# Patient Record
Sex: Female | Born: 1990 | Race: White | Hispanic: No | Marital: Single | Smoking: Former smoker
Health system: Southern US, Community
[De-identification: ages and names within clinical notes are randomized; demographics above are authoritative.]

## PROBLEM LIST (undated history)

## (undated) ENCOUNTER — Inpatient Hospital Stay (HOSPITAL_COMMUNITY): Payer: Self-pay

## (undated) DIAGNOSIS — F32A Depression, unspecified: Secondary | ICD-10-CM

## (undated) DIAGNOSIS — F419 Anxiety disorder, unspecified: Secondary | ICD-10-CM

## (undated) DIAGNOSIS — F329 Major depressive disorder, single episode, unspecified: Secondary | ICD-10-CM

## (undated) DIAGNOSIS — N390 Urinary tract infection, site not specified: Secondary | ICD-10-CM

## (undated) DIAGNOSIS — E282 Polycystic ovarian syndrome: Secondary | ICD-10-CM

## (undated) DIAGNOSIS — R569 Unspecified convulsions: Secondary | ICD-10-CM

## (undated) HISTORY — PX: WISDOM TOOTH EXTRACTION: SHX21

---
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
## 2010-07-10 ENCOUNTER — Emergency Department (HOSPITAL_COMMUNITY): Admission: EM | Admit: 2010-07-10 | Discharge: 2010-04-28 | Payer: Self-pay | Admitting: Emergency Medicine

## 2015-12-28 ENCOUNTER — Encounter (HOSPITAL_COMMUNITY): Payer: Self-pay | Admitting: Family Medicine

## 2015-12-28 ENCOUNTER — Emergency Department (HOSPITAL_COMMUNITY)
Admission: EM | Admit: 2015-12-28 | Discharge: 2015-12-29 | Disposition: A | Discharge status: Other Acute Inpatient Hospital | Payer: 59 | Attending: Emergency Medicine | Admitting: Emergency Medicine

## 2015-12-28 DIAGNOSIS — N760 Acute vaginitis: Secondary | ICD-10-CM | POA: Insufficient documentation

## 2015-12-28 DIAGNOSIS — T39312A Poisoning by propionic acid derivatives, intentional self-harm, initial encounter: Secondary | ICD-10-CM | POA: Diagnosis not present

## 2015-12-28 DIAGNOSIS — F329 Major depressive disorder, single episode, unspecified: Secondary | ICD-10-CM | POA: Diagnosis not present

## 2015-12-28 DIAGNOSIS — T1491 Suicide attempt: Secondary | ICD-10-CM | POA: Diagnosis not present

## 2015-12-28 DIAGNOSIS — F17200 Nicotine dependence, unspecified, uncomplicated: Secondary | ICD-10-CM | POA: Insufficient documentation

## 2015-12-28 DIAGNOSIS — F32A Depression, unspecified: Secondary | ICD-10-CM

## 2015-12-28 DIAGNOSIS — T391X2A Poisoning by 4-Aminophenol derivatives, intentional self-harm, initial encounter: Secondary | ICD-10-CM

## 2015-12-28 DIAGNOSIS — B9689 Other specified bacterial agents as the cause of diseases classified elsewhere: Secondary | ICD-10-CM

## 2015-12-28 HISTORY — DX: Major depressive disorder, single episode, unspecified: F32.9

## 2015-12-28 HISTORY — DX: Depression, unspecified: F32.A

## 2015-12-28 LAB — WET PREP, GENITAL
Sperm: NONE SEEN
Trich, Wet Prep: NONE SEEN
Yeast Wet Prep HPF POC: NONE SEEN

## 2015-12-28 LAB — COMPREHENSIVE METABOLIC PANEL
ALK PHOS: 58 U/L (ref 38–126)
ALT: 12 U/L — AB (ref 14–54)
AST: 18 U/L (ref 15–41)
Albumin: 4.5 g/dL (ref 3.5–5.0)
Anion gap: 8 (ref 5–15)
BUN: 6 mg/dL (ref 6–20)
CALCIUM: 9.8 mg/dL (ref 8.9–10.3)
CHLORIDE: 106 mmol/L (ref 101–111)
CO2: 25 mmol/L (ref 22–32)
CREATININE: 0.88 mg/dL (ref 0.44–1.00)
Glucose, Bld: 97 mg/dL (ref 65–99)
Potassium: 3.6 mmol/L (ref 3.5–5.1)
Sodium: 139 mmol/L (ref 135–145)
Total Bilirubin: 0.4 mg/dL (ref 0.3–1.2)
Total Protein: 7.3 g/dL (ref 6.5–8.1)

## 2015-12-28 LAB — URINALYSIS, ROUTINE W REFLEX MICROSCOPIC
BILIRUBIN URINE: NEGATIVE
GLUCOSE, UA: NEGATIVE mg/dL
HGB URINE DIPSTICK: NEGATIVE
KETONES UR: 15 mg/dL — AB
Leukocytes, UA: NEGATIVE
Nitrite: NEGATIVE
PH: 6 (ref 5.0–8.0)
PROTEIN: NEGATIVE mg/dL
Specific Gravity, Urine: 1.028 (ref 1.005–1.030)

## 2015-12-28 LAB — I-STAT BETA HCG BLOOD, ED (MC, WL, AP ONLY)

## 2015-12-28 LAB — RAPID URINE DRUG SCREEN, HOSP PERFORMED
AMPHETAMINES: NOT DETECTED
Barbiturates: NOT DETECTED
Benzodiazepines: POSITIVE — AB
Cocaine: NOT DETECTED
Opiates: NOT DETECTED
TETRAHYDROCANNABINOL: POSITIVE — AB

## 2015-12-28 LAB — CBC
HCT: 43.1 % (ref 36.0–46.0)
HEMOGLOBIN: 15.3 g/dL — AB (ref 12.0–15.0)
MCH: 30.4 pg (ref 26.0–34.0)
MCHC: 35.5 g/dL (ref 30.0–36.0)
MCV: 85.7 fL (ref 78.0–100.0)
Platelets: 204 10*3/uL (ref 150–400)
RBC: 5.03 MIL/uL (ref 3.87–5.11)
RDW: 12 % (ref 11.5–15.5)
WBC: 7.2 10*3/uL (ref 4.0–10.5)

## 2015-12-28 LAB — SALICYLATE LEVEL

## 2015-12-28 LAB — ETHANOL

## 2015-12-28 LAB — ACETAMINOPHEN LEVEL
ACETAMINOPHEN (TYLENOL), SERUM: 24 ug/mL (ref 10–30)
ACETAMINOPHEN (TYLENOL), SERUM: 17 ug/mL (ref 10–30)
Acetaminophen (Tylenol), Serum: 23 ug/mL (ref 10–30)

## 2015-12-28 MED ORDER — LIDOCAINE HCL (PF) 1 % IJ SOLN
5.0000 mL | Freq: Once | INTRAMUSCULAR | Status: AC
  Administered 2015-12-28: 5 mL
  Filled 2015-12-28: qty 5

## 2015-12-28 MED ORDER — METRONIDAZOLE 500 MG PO TABS
500.0000 mg | ORAL_TABLET | Freq: Two times a day (BID) | ORAL | Status: DC
  Administered 2015-12-28 – 2015-12-29 (×2): 500 mg via ORAL
  Filled 2015-12-28 (×2): qty 1

## 2015-12-28 MED ORDER — AZITHROMYCIN 250 MG PO TABS
1000.0000 mg | ORAL_TABLET | Freq: Once | ORAL | Status: AC
  Administered 2015-12-28: 1000 mg via ORAL
  Filled 2015-12-28: qty 4

## 2015-12-28 MED ORDER — CEFTRIAXONE SODIUM 250 MG IJ SOLR
250.0000 mg | Freq: Once | INTRAMUSCULAR | Status: AC
  Administered 2015-12-28: 250 mg via INTRAMUSCULAR
  Filled 2015-12-28: qty 250

## 2015-12-28 NOTE — ED Notes (Addendum)
medgiven pt pleasant .  She just feels sleepy.  Med ingested 1700 she ate last this am before the pills.  Sandwich 1 hour ago

## 2015-12-28 NOTE — ED Notes (Signed)
The pt has not had an adverse reaction to the rocephin shot  Moved to pod c  rm 24

## 2015-12-28 NOTE — ED Notes (Addendum)
Pt moved from triage no sitter family at the bedside.  Pa saw before myself

## 2015-12-28 NOTE — ED Provider Notes (Addendum)
CSN: [id number]     Arrival date & time 12/28/15  1739 History   First MD Initiated Contact with Patient 12/28/15 2035     Chief Complaint  Patient presents with  . Drug Overdose     (Consider location/radiation/quality/duration/timing/severity/associated sxs/prior Treatment) HPI Comments: Patient with history of depression currently being weaned off zoloft presents with overwhelm and increased depression with suicidal ideation and attempt. She reports taking 6 tylenol and 6 alleve at 5:00 pm. No postingestion vomiting. She denies history of attempt or suicidal gesture. She complains of vaginal discharge, "I think I have a bacterial infection". No abdominal pain or fever. No nausea. She denies dysuria but reports she has to strain for urination x 1-2 days.   The history is provided by the patient. No language interpreter was used.    Past Medical History  Diagnosis Date  . Depression    History reviewed. No pertinent past surgical history. History reviewed. No pertinent family history. Social History  Substance Use Topics  . Smoking status: Current Every Day Smoker  . Smokeless tobacco: None  . Alcohol Use: No   OB History    No data available     Review of Systems  Constitutional: Negative for fever and chills.  Respiratory: Negative.  Negative for shortness of breath.   Cardiovascular: Negative.  Negative for chest pain.  Gastrointestinal: Negative.  Negative for nausea, vomiting and abdominal pain.  Genitourinary: Positive for vaginal discharge and difficulty urinating. Negative for dysuria, vaginal bleeding and menstrual problem.  Musculoskeletal: Negative.  Negative for myalgias.  Skin: Positive for rash (inner thighs).  Neurological: Negative.   Psychiatric/Behavioral: Positive for suicidal ideas and dysphoric mood.      Allergies  Review of patient's allergies indicates no known allergies.  Home Medications   Prior to Admission medications   Not on File    BP 126/80 mmHg  Pulse 85  Temp(Src) 99 F (37.2 C) (Oral)  Resp 12  Ht  (1.6 m)  SpO2 100%  LMP 12/15/2015 Physical Exam  Constitutional: She is oriented to person, place, and time. She appears well-developed and well-nourished.  HENT:  Head: Normocephalic.  Neck: Normal range of motion. Neck supple.  Cardiovascular: Normal rate and regular rhythm.   Pulmonary/Chest: Effort normal and breath sounds normal.  Abdominal: Soft. Bowel sounds are normal. There is no tenderness. There is no rebound and no guarding.  Genitourinary:  Thin, grayish discharge present in vaginal vault. No CMT, adnexal mass or tenderness. Cervix non-friable.  Musculoskeletal: Normal range of motion.  Neurological: She is alert and oriented to person, place, and time.  Skin: Skin is warm and dry. No rash noted.  Psychiatric: Her speech is normal. She exhibits a depressed mood. She expresses suicidal ideation.    ED Course  Procedures (including critical care time) Labs Review Labs Reviewed  COMPREHENSIVE METABOLIC PANEL - Abnormal; Notable for the following:    ALT 12 (*)    All other components within normal limits  CBC - Abnormal; Notable for the following:    Hemoglobin 15.3 (*)    All other components within normal limits  URINE RAPID DRUG SCREEN, HOSP PERFORMED - Abnormal; Notable for the following:    Benzodiazepines POSITIVE (*)    Tetrahydrocannabinol POSITIVE (*)    All other components within normal limits  WET PREP, GENITAL  ETHANOL  SALICYLATE LEVEL  ACETAMINOPHEN LEVEL  ACETAMINOPHEN LEVEL  RPR  HIV ANTIBODY (ROUTINE TESTING)  URINALYSIS, ROUTINE W REFLEX MICROSCOPIC (NOT AT [facility])  I-STAT BETA HCG BLOOD, ED ([facility], [facility], [facility] ONLY)  GC/CHLAMYDIA PROBE AMP ([place]) NOT AT [facility]    Imaging Review No results found. I have personally reviewed and evaluated these images and lab results as part of my medical decision-making.   EKG Interpretation None      MDM   Final  diagnoses:  None    1. Suicide attempt 2. BV  The patient presents after intentional overdose ingestion of Tylenol and Aleve. She is considered medically cleared for TTS evaluation and treatment.   She complained of vaginal discharge and exam and tests reveal BV. She requests full STD panel testing and RPR, HIV, GC/chlamydia are pending. She has been treated with Zithromax (1 gm) and Rocephin (250 mg IM) based on concern for exposure.   10:40 - [facility] transfer after 7:00 am, accepting Dr. Jama Flavorsobos. Behavioral Health NP requiring a 3rd Tylenol level which has been ordered.     Elpidio AnisShari Sabine Tenenbaum, PA-C 12/28/15 2225  Marily MemosJason Mesner, MD 12/28/15 2235  Elpidio AnisShari Dayanne Yiu, PA-C 12/30/15 0022  Marily MemosJason Mesner, MD 12/30/15 [phone]
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
## 2015-12-28 NOTE — ED Notes (Signed)
Doing TTS assessment at this time.  Family left for the night.

## 2015-12-28 NOTE — ED Notes (Signed)
Pt sts that she took 6 tylenol PM and 6 aleve pm about 1 hour ago. sts that she was having suicidal thoughts.

## 2015-12-29 ENCOUNTER — Inpatient Hospital Stay (HOSPITAL_COMMUNITY)
Admission: AD | Admit: 2015-12-29 | Discharge: 2015-12-31 | DRG: 885 | Disposition: A | Discharge status: Home / Self Care | Payer: 59 | Source: Intra-hospital | Attending: Psychiatry | Admitting: Psychiatry

## 2015-12-29 ENCOUNTER — Encounter (HOSPITAL_COMMUNITY): Payer: Self-pay | Admitting: *Deleted

## 2015-12-29 DIAGNOSIS — G4700 Insomnia, unspecified: Secondary | ICD-10-CM | POA: Diagnosis present

## 2015-12-29 DIAGNOSIS — F410 Panic disorder [episodic paroxysmal anxiety] without agoraphobia: Secondary | ICD-10-CM | POA: Diagnosis present

## 2015-12-29 DIAGNOSIS — Z818 Family history of other mental and behavioral disorders: Secondary | ICD-10-CM

## 2015-12-29 DIAGNOSIS — F332 Major depressive disorder, recurrent severe without psychotic features: Principal | ICD-10-CM | POA: Diagnosis present

## 2015-12-29 DIAGNOSIS — F17200 Nicotine dependence, unspecified, uncomplicated: Secondary | ICD-10-CM | POA: Diagnosis present

## 2015-12-29 DIAGNOSIS — F4000 Agoraphobia, unspecified: Secondary | ICD-10-CM | POA: Diagnosis present

## 2015-12-29 DIAGNOSIS — F329 Major depressive disorder, single episode, unspecified: Secondary | ICD-10-CM | POA: Diagnosis present

## 2015-12-29 DIAGNOSIS — T1491 Suicide attempt: Secondary | ICD-10-CM | POA: Diagnosis not present

## 2015-12-29 LAB — HIV ANTIBODY (ROUTINE TESTING W REFLEX): HIV Screen 4th Generation wRfx: NONREACTIVE

## 2015-12-29 LAB — RPR: RPR: NONREACTIVE

## 2015-12-29 MED ORDER — METRONIDAZOLE 500 MG PO TABS
500.0000 mg | ORAL_TABLET | Freq: Two times a day (BID) | ORAL | Status: DC
  Administered 2015-12-29 – 2015-12-31 (×4): 500 mg via ORAL
  Filled 2015-12-29 (×8): qty 1

## 2015-12-29 MED ORDER — ACETAMINOPHEN 325 MG PO TABS: 650.0000 mg | ORAL_TABLET | Freq: Four times a day (QID) | ORAL | Status: DC | PRN

## 2015-12-29 MED ORDER — ALUM & MAG HYDROXIDE-SIMETH 200-200-20 MG/5ML PO SUSP
30.0000 mL | ORAL | Status: DC | PRN
  Administered 2015-12-30: 30 mL via ORAL
  Filled 2015-12-29: qty 30

## 2015-12-29 MED ORDER — MAGNESIUM HYDROXIDE 400 MG/5ML PO SUSP: 30.0000 mL | Freq: Every day | ORAL | Status: DC | PRN

## 2015-12-29 MED ORDER — NICOTINE 14 MG/24HR TD PT24
14.0000 mg | MEDICATED_PATCH | Freq: Once | TRANSDERMAL | Status: DC
Start: 2015-12-29 — End: 2015-12-29
  Administered 2015-12-29: 14 mg via TRANSDERMAL
  Filled 2015-12-29: qty 1

## 2015-12-29 MED ORDER — TRAZODONE HCL 50 MG PO TABS
50.0000 mg | ORAL_TABLET | Freq: Every evening | ORAL | Status: DC | PRN
  Administered 2015-12-30: 50 mg via ORAL
  Filled 2015-12-29: qty 1

## 2015-12-29 MED ORDER — FLUOXETINE HCL 20 MG PO CAPS
20.0000 mg | ORAL_CAPSULE | Freq: Every day | ORAL | Status: DC
  Filled 2015-12-29 (×4): qty 1

## 2015-12-29 NOTE — ED Notes (Signed)
Pt signed [facility]- [facility], copy placed in medical records and [facility].
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
## 2015-12-29 NOTE — Progress Notes (Signed)
Adult Psychoeducational Group Note  Date:  12/29/2015 Time:  8:15 PM  Group Topic/Focus:  Wrap-Up Group:   The focus of this group is to help patients review their daily goal of treatment and discuss progress on daily workbooks.  Participation Level:  Active  Participation Quality:  Appropriate  Affect:  Irritable and Tearful  Cognitive:  Appropriate  Insight: Appropriate  Engagement in Group:  Engaged  Modes of Intervention:  Discussion  Additional Comments:  Pt was tearful during wrap-up group and seemed slightly angry. Pt rated her overall day "a 10 with other patients and a 3 with staff". Pt was upset during wrap-up group because a staff member told her that she could not be on the phone during wrap-up group. Pt reported that she did not have a goal for the day.   Cleotilde NeerJasmine S [place] 12/29/2015, 9:05 PM

## 2015-12-29 NOTE — ED Notes (Signed)
Pt on phone notifying family member of [facility].

## 2015-12-29 NOTE — ED Notes (Addendum)
No Sitter available for pt at this time per Penni BombardK Moon, Consulting civil engineerCharge RN. RN monitoring pt.

## 2015-12-29 NOTE — Tx Team (Signed)
Initial Interdisciplinary Treatment Plan   PATIENT STRESSORS: Educational concerns Financial difficulties Legal issue Marital or family conflict Occupational concerns Traumatic event   PATIENT STRENGTHS: Ability for insight Active sense of humor Average or above average intelligence   PROBLEM LIST: Problem List/Patient Goals Date to be addressed Date deferred Reason deferred Estimated date of resolution  Depression with suicidal ideation 12/29/2015     Self injurious behaviors 12/29/2015                 :" I love my son" 12/29/2015     " I take good care of my son" 12/29/2015                        DISCHARGE CRITERIA:  Ability to meet basic life and health needs Adequate post-discharge living arrangements Improved stabilization in mood, thinking, and/or behavior  PRELIMINARY DISCHARGE PLAN: Attend aftercare/continuing care group Attend PHP/IOP Attend 12-step recovery group Outpatient therapy  PATIENT/FAMIILY INVOLVEMENT: This treatment plan has been presented to and reviewed with the patient, Melanie Serrano, and/or family member, .  The patient and family have been given the opportunity to ask questions and make suggestions.  Rich BraveDuke, Kissa Campoy Lynn 12/29/2015, 2:32 PM

## 2015-12-29 NOTE — [facility] Suicide Risk Assessment (Signed)
[facility]   Nursing information obtained from:   patient and chart  Demographic factors:   25 year old  Married female , one child Current Mental Status:   see below  Loss Factors:   unemployment, father in law using drugs  Historical Factors:   depression, anxiety  Risk Reduction Factors:   resilience, sense of responsibility to family   Total Time spent with patient: 45 minutes Principal Problem: MDD  Diagnosis:   Patient Active Problem List   Diagnosis Date Noted  . MDD (major depressive disorder) (HCC) [F32.9] 12/29/2015  . MDD (major depressive disorder), recurrent severe, without psychosis (HCC) [F33.2] 12/29/2015     Continued Clinical Symptoms:  Alcohol Use Disorder Identification Test Final Score (AUDIT): 0 The "Alcohol Use Disorders Identification Test", Guidelines for Use in Primary Care, Second Edition.  World Science writerHealth Organization [facility](WHO). Score between 0-7:  no or low risk or alcohol related problems. Score between 8-15:  moderate risk of alcohol related problems. Score between 16-19:  high risk of alcohol related problems. Score 20 or above:  warrants further diagnostic evaluation for alcohol dependence and treatment.   CLINICAL FACTORS:  25 year old married female, status post impulsive overdose/suicide attempt- ingested several NSAID, Acetaminophen. Reports significant  Psychosocial stressors, to include unemployment and father in law using drugs. She presents depressed, tearful, but denies suicidal ideations at this time    Psychiatric Specialty Exam: Physical Exam  ROS  Blood pressure 118/77, pulse 112, temperature 98.5 F (36.9 C), temperature source Oral, resp. rate 15, height 5' 3.75" (1.619 m), weight 140 lb (63.504 kg), last menstrual period 12/15/2015, SpO2 100 %.Body mass index is 24.23 kg/(m^2).   see admit note MSE    COGNITIVE FEATURES THAT CONTRIBUTE TO RISK:  Closed-mindedness and Loss of executive function    SUICIDE  RISK:   Moderate:  Frequent suicidal ideation with limited intensity, and duration, some specificity in terms of plans, no associated intent, good self-control, limited dysphoria/symptomatology, some risk factors present, and identifiable protective factors, including available and accessible social support.  PLAN OF CARE: Patient will be admitted to inpatient psychiatric unit for stabilization and safety. Will provide and encourage milieu participation. Provide medication management and maked adjustments as needed.  Will follow daily.    I certify that inpatient services furnished can reasonably be expected to improve the patient's condition.   Nehemiah MassedOBOS, Charmian Forbis, MD 12/29/2015, 4:26 PM

## 2015-12-29 NOTE — ED Notes (Addendum)
[id number] [place]

## 2015-12-29 NOTE — ED Provider Notes (Signed)
[facility] Jama Flavorsobos.  BP 110/64 mmHg  Pulse 71  Temp(Src) 98.2 F (36.8 C) (Oral)  Resp 16  Ht 5\' 3"  (1.6 m)  SpO2 100%  LMP 12/15/2015   Glynn OctaveStephen Tyna Huertas, MD 12/29/15 1015

## 2015-12-29 NOTE — [facility] Group Notes (Signed)
[facility] LCSW Group Therapy  12/29/2015 10:00 AM  Type of Therapy:  Group Therapy  Participation Level:  Did Not Attend   Beverly SessionsLINDSEY, Josip Merolla J 12/29/2015, 1:13 PM
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
## 2015-12-30 MED ORDER — NICOTINE 21 MG/24HR TD PT24
21.0000 mg | MEDICATED_PATCH | Freq: Every day | TRANSDERMAL | Status: DC
  Administered 2015-12-30: 21 mg via TRANSDERMAL
  Filled 2015-12-30 (×3): qty 1

## 2015-12-30 NOTE — Progress Notes (Signed)
Recreation Therapy Notes  Date: 05.29.2017 Time: 9:30am Location: [address] Group Room   Group Topic: Stress Management  Goal Area(s) Addresses:  Patient will actively participate in stress management techniques presented during session.   Behavioral Response: Engaged, Attentive, Appropriate   Intervention: Stress management techniques  Activity :  Deep Breathing and Progressive Body Relaxation. LRT provided education, instruction and demonstration on practice of Deep Breathing and Progressive Body Relaxation. Patient was asked to participate in technique introduced during session.   Education:  Stress Management, Discharge Planning.   Education Outcome: Acknowledges education  Clinical Observations/Feedback: Patient actively engaged in technique introduced, expressed no concerns and demonstrated ability to practice independently post d/c.   Marykay Lexenise L Raman [place], LRT/CTRS        Jearl KlinefelterBlanchfield, Elliyah Liszewski L 12/30/2015 2:39 PM

## 2015-12-30 NOTE — Progress Notes (Signed)
D: Pt presents anxious on approach. Pt rates depression 6/10. Hopeless 6/10. Pt denies suicidal thoughts. Pt refused morning dose of Prozac. Pt requesting to speak to MD before starting a new med. MD made aware. Pt reported that she's utilizing her coping skills to cope with depression. Pt stated that her depression stimulated from her poor eating and self neglect. Pt verbalized that her appetite is fair today. Pt compliant with attending groups today. A: Medications administered as ordered per MD. Medications reviewed with pt. Verbal support provided. Pt encouraged to attend groups. R: Pt stated goal "eat a whole meal". Pt receptive to tx.

## 2015-12-30 NOTE — Progress Notes (Signed)
[facility] MD Progress Note  12/30/2015 4:37 PM Melanie Serrano  MRN:  [id number] Subjective:   Patient reports she is feeling better than on admission, and reports having " a better outlook".  Objective : I have reviewed chart notes and have met with patient. Today presents with improved mood and range of affect.  No disruptive or agitated behaviors on unit, going to some groups. Has been noted to be interacting well , socializing with peers , visible in milieu.  States she has learnt  " a lot" in groups and today spoke about learning techniques to " relax, and stay calm". Today reported some ambivalence about taking Prozac ( or another antidepressant ) , expressing concern about possible side effects, and stating that she prefers " natural"/ holistic treatments . We reviewed Prozac  side effect profile , safety considerations . States she slept better last night. As she improves she is focusing more on discharging soon.    Principal Problem: MDD (major depressive disorder) ([place]) Diagnosis:   Patient Active Problem List   Diagnosis Date Noted  . MDD (major depressive disorder) ([place]) [F32.9] 12/29/2015  . MDD (major depressive disorder), recurrent severe, without psychosis ([place]) [F33.2] 12/29/2015   Total Time spent with patient: 20 minutes     Past Medical History:  Past Medical History  Diagnosis Date  . Depression    History reviewed. No pertinent past surgical history. Family History: History reviewed. No pertinent family history.  Social History:  History  Alcohol Use No     History  Drug Use Not on file    Social History   Social History  . Marital Status: Married    Spouse Name: N/A  . Number of Children: N/A  . Years of Education: N/A   Social History Main Topics  . Smoking status: Current Every Day Smoker  . Smokeless tobacco: None  . Alcohol Use: No  . Drug Use: None  . Sexual Activity: Yes    Birth Control/ Protection: None   Other Topics Concern  . None    Social History Narrative   Additional Social History:    Pain Medications: N/A Prescriptions: N/A Over the Counter: tok overdose of tylenol and aleve on sat 5/27 History of alcohol / drug use?: Yes Negative Consequences of Use: Financial, Legal, Personal relationships Name of Substance 1: Marijuana 1 - Age of First Use: 24 1 - Amount (size/oz): 1/4 gram 1 - Frequency: Daily 1 - Duration: One year 1 - Last Use / Amount: 12/26/15  Sleep: Good  Appetite:  Good  Current Medications: Current Facility-Administered Medications  Medication Dose Route Frequency Provider Last Rate Last Dose  . acetaminophen (TYLENOL) tablet 650 mg  650 mg Oral Q6H PRN Derrill Center, NP      . alum & mag hydroxide-simeth (MAALOX/MYLANTA) 200-200-20 MG/5ML suspension 30 mL  30 mL Oral Q4H PRN Derrill Center, NP      . FLUoxetine (PROZAC) capsule 20 mg  20 mg Oral Daily Jenne Campus, MD   20 mg at 12/30/15 0805  . magnesium hydroxide (MILK OF MAGNESIA) suspension 30 mL  30 mL Oral Daily PRN Derrill Center, NP      . metroNIDAZOLE (FLAGYL) tablet 500 mg  500 mg Oral Q12H Lurena Nida, NP   500 mg at 12/30/15 0803  . nicotine (NICODERM CQ - dosed in mg/24 hours) patch 21 mg  21 mg Transdermal Daily Myer Peer Cobos, MD   21 mg at 12/30/15 1045  .  traZODone (DESYREL) tablet 50 mg  50 mg Oral QHS PRN Lurena Nida, NP        Lab Results:  Results for orders placed or performed during the hospital encounter of 12/28/15 (from the past 48 hour(s))  Comprehensive metabolic panel     Status: Abnormal   Collection Time: 12/28/15  6:13 PM  Result Value Ref Range   Sodium 139 135 - 145 mmol/L   Potassium 3.6 3.5 - 5.1 mmol/L   Chloride 106 101 - 111 mmol/L   CO2 25 22 - 32 mmol/L   Glucose, Bld 97 65 - 99 mg/dL   BUN 6 6 - 20 mg/dL   Creatinine, Ser 0.88 0.44 - 1.00 mg/dL   Calcium 9.8 8.9 - 10.3 mg/dL   Total Protein 7.3 6.5 - 8.1 g/dL   Albumin 4.5 3.5 - 5.0 g/dL   AST 18 15 - 41 U/L   ALT 12 (L)  14 - 54 U/L   Alkaline Phosphatase 58 38 - 126 U/L   Total Bilirubin 0.4 0.3 - 1.2 mg/dL   GFR calc non Af Amer >60 >60 mL/min   GFR calc Af Amer >60 >60 mL/min    Comment: (NOTE) The eGFR has been calculated using the CKD EPI equation. This calculation has not been validated in all clinical situations. eGFR's persistently <60 mL/min signify possible Chronic Kidney Disease.    Anion gap 8 5 - 15  Ethanol     Status: None   Collection Time: 12/28/15  6:13 PM  Result Value Ref Range   Alcohol, Ethyl (B) <5 <5 mg/dL    Comment:        LOWEST DETECTABLE LIMIT FOR SERUM ALCOHOL IS 5 mg/dL FOR MEDICAL PURPOSES ONLY   Salicylate level     Status: None   Collection Time: 12/28/15  6:13 PM  Result Value Ref Range   Salicylate Lvl <1.1 2.8 - 30.0 mg/dL  Acetaminophen level     Status: None   Collection Time: 12/28/15  6:13 PM  Result Value Ref Range   Acetaminophen (Tylenol), Serum 23 10 - 30 ug/mL    Comment:        THERAPEUTIC CONCENTRATIONS VARY SIGNIFICANTLY. A RANGE OF 10-30 ug/mL MAY BE AN EFFECTIVE CONCENTRATION FOR MANY PATIENTS. HOWEVER, SOME ARE BEST TREATED AT CONCENTRATIONS OUTSIDE THIS RANGE. ACETAMINOPHEN CONCENTRATIONS >150 ug/mL AT 4 HOURS AFTER INGESTION AND >50 ug/mL AT 12 HOURS AFTER INGESTION ARE OFTEN ASSOCIATED WITH TOXIC REACTIONS.   cbc     Status: Abnormal   Collection Time: 12/28/15  6:13 PM  Result Value Ref Range   WBC 7.2 4.0 - 10.5 K/uL   RBC 5.03 3.87 - 5.11 MIL/uL   Hemoglobin 15.3 (H) 12.0 - 15.0 g/dL   HCT 43.1 36.0 - 46.0 %   MCV 85.7 78.0 - 100.0 fL   MCH 30.4 26.0 - 34.0 pg   MCHC 35.5 30.0 - 36.0 g/dL   RDW 12.0 11.5 - 15.5 %   Platelets 204 150 - 400 K/uL  I-Stat beta hCG blood, ED     Status: None   Collection Time: 12/28/15  6:23 PM  Result Value Ref Range   I-stat hCG, quantitative <5.0 <5 mIU/mL   Comment 3            Comment:   GEST. AGE      CONC.  (mIU/mL)   <=1 WEEK        5 - 50     2 WEEKS  50 - 500     3  WEEKS       100 - 10,000     4 WEEKS     1,000 - 30,000        FEMALE AND NON-PREGNANT FEMALE:     LESS THAN 5 mIU/mL   Rapid urine drug screen (hospital performed)     Status: Abnormal   Collection Time: 12/28/15  7:07 PM  Result Value Ref Range   Opiates NONE DETECTED NONE DETECTED   Cocaine NONE DETECTED NONE DETECTED   Benzodiazepines POSITIVE (A) NONE DETECTED   Amphetamines NONE DETECTED NONE DETECTED   Tetrahydrocannabinol POSITIVE (A) NONE DETECTED   Barbiturates NONE DETECTED NONE DETECTED    Comment:        DRUG SCREEN FOR MEDICAL PURPOSES ONLY.  IF CONFIRMATION IS NEEDED FOR ANY PURPOSE, NOTIFY LAB WITHIN 5 DAYS.        LOWEST DETECTABLE LIMITS FOR URINE DRUG SCREEN Drug Class       Cutoff (ng/mL) Amphetamine      1000 Barbiturate      200 Benzodiazepine   809 Tricyclics       983 Opiates          300 Cocaine          300 THC              50   Urinalysis, Routine w reflex microscopic     Status: Abnormal   Collection Time: 12/28/15  7:19 PM  Result Value Ref Range   Color, Urine AMBER (A) YELLOW    Comment: BIOCHEMICALS MAY BE AFFECTED BY COLOR   APPearance CLOUDY (A) CLEAR   Specific Gravity, Urine 1.028 1.005 - 1.030   pH 6.0 5.0 - 8.0   Glucose, UA NEGATIVE NEGATIVE mg/dL   Hgb urine dipstick NEGATIVE NEGATIVE   Bilirubin Urine NEGATIVE NEGATIVE   Ketones, ur 15 (A) NEGATIVE mg/dL   Protein, ur NEGATIVE NEGATIVE mg/dL   Nitrite NEGATIVE NEGATIVE   Leukocytes, UA NEGATIVE NEGATIVE    Comment: MICROSCOPIC NOT DONE ON URINES WITH NEGATIVE PROTEIN, BLOOD, LEUKOCYTES, NITRITE, OR GLUCOSE <1000 mg/dL.  Wet prep, genital     Status: Abnormal   Collection Time: 12/28/15  9:08 PM  Result Value Ref Range   Yeast Wet Prep HPF POC NONE SEEN NONE SEEN   Trich, Wet Prep NONE SEEN NONE SEEN   Clue Cells Wet Prep HPF POC PRESENT (A) NONE SEEN   WBC, Wet Prep HPF POC FEW (A) NONE SEEN   Sperm NONE SEEN   Acetaminophen level     Status: None   Collection Time:  12/28/15  9:09 PM  Result Value Ref Range   Acetaminophen (Tylenol), Serum 24 10 - 30 ug/mL    Comment:        THERAPEUTIC CONCENTRATIONS VARY SIGNIFICANTLY. A RANGE OF 10-30 ug/mL MAY BE AN EFFECTIVE CONCENTRATION FOR MANY PATIENTS. HOWEVER, SOME ARE BEST TREATED AT CONCENTRATIONS OUTSIDE THIS RANGE. ACETAMINOPHEN CONCENTRATIONS >150 ug/mL AT 4 HOURS AFTER INGESTION AND >50 ug/mL AT 12 HOURS AFTER INGESTION ARE OFTEN ASSOCIATED WITH TOXIC REACTIONS.   RPR     Status: None   Collection Time: 12/28/15  9:09 PM  Result Value Ref Range   RPR Ser Ql Non Reactive Non Reactive    Comment: (NOTE) Performed At: [facility] [address] [place], [place] [id number] Lindon Romp MD [id number]   HIV antibody     Status: None   Collection Time: 12/28/15  9:09 PM  Result Value Ref Range   HIV Screen 4th Generation wRfx Non Reactive Non Reactive    Comment: (NOTE) Performed At: [facility] [place], [place] [id number] Lindon Romp MD [id number]   Acetaminophen level     Status: None   Collection Time: 12/28/15 10:57 PM  Result Value Ref Range   Acetaminophen (Tylenol), Serum 17 10 - 30 ug/mL    Comment:        THERAPEUTIC CONCENTRATIONS VARY SIGNIFICANTLY. A RANGE OF 10-30 ug/mL MAY BE AN EFFECTIVE CONCENTRATION FOR MANY PATIENTS. HOWEVER, SOME ARE BEST TREATED AT CONCENTRATIONS OUTSIDE THIS RANGE. ACETAMINOPHEN CONCENTRATIONS >150 ug/mL AT 4 HOURS AFTER INGESTION AND >50 ug/mL AT 12 HOURS AFTER INGESTION ARE OFTEN ASSOCIATED WITH TOXIC REACTIONS.     Blood Alcohol level:  Lab Results  Component Value Date   ETH <5 12/28/2015    Physical Findings: AIMS: Facial and Oral Movements Muscles of Facial Expression: None, normal Lips and Perioral Area: None, normal Jaw: None, normal Tongue: None, normal,Extremity Movements Upper (arms, wrists, hands, fingers): None, normal Lower (legs, knees, ankles, toes): None, normal,  Trunk Movements Neck, shoulders, hips: None, normal, Overall Severity Severity of abnormal movements (highest score from questions above): None, normal Incapacitation due to abnormal movements: None, normal Patient's awareness of abnormal movements (rate only patient's report): No Awareness, Dental Status Current problems with teeth and/or dentures?: No Does patient usually wear dentures?: No  CIWA:  CIWA-Ar Total: 2 COWS:  COWS Total Score: 2  Musculoskeletal: Strength & Muscle Tone: within normal limits Gait & Station: normal Patient leans: N/A  Psychiatric Specialty Exam: Physical Exam  ROS denies headache, denies chest pain, no shortness of breath, no nausea or vomiting   Blood pressure 122/61, pulse 95, temperature 98.4 F (36.9 C), temperature source Oral, resp. rate 16, height 5' 3.75" (1.619 m), weight 140 lb (63.504 kg), last menstrual period 12/15/2015, SpO2 100 %.Body mass index is 24.23 kg/(m^2).  General Appearance: improved grooming   Eye Contact:  Good  Speech:  Normal Rate  Volume:  Normal  Mood:  improved mood, states she is feeling better today  Affect:  more reactive   Thought Process:  Linear  Orientation:  Full (Time, Place, and Person)  Thought Content:  denies hallucinations, no delusions   Suicidal Thoughts:  No denies suicidal or self injurious ideations  Homicidal Thoughts:  No  Memory:  recent and remote grossly intact   Judgement:  Improving   Insight:  improving   Psychomotor Activity:  Normal  Concentration:  Concentration: Good and Attention Span: Good  Recall:  Good  Fund of Knowledge:  Good  Language:  Good  Akathisia:  Negative  Handed:  Right  AIMS (if indicated):     Assets:  Desire for Improvement Resilience  ADL's:  Intact  Cognition:  WNL  Sleep:  Number of Hours: 6.25   Assessment - patient reports improved mood and presenting with improved range of affect. At this time reporting feeling more optimistic, and less ruminative about  home stressors. Ambivalent about taking Prozac or another antidepressant, concerned about side effects. Provided reassurance and reviewed side effect profile, rationale for treatment, to assist her in making  informed decision regarding whether to take this medication .   Treatment Plan Summary: Daily contact with patient to assess and evaluate symptoms and progress in treatment, Medication management, Plan inpatient treatment' and mecications as below  Continue to encourage group, milieu participation to work on Radiographer, therapeutic  and symptom reduction Continue Prozac 20 mgrs QDAY for depression and anxiety Continue Trazodone 50 mgrs QHS PRN for insomnia Treatment team working on disposition planning  Neita Garnet, MD 12/30/2015, 4:37 PM

## 2015-12-30 NOTE — Tx Team (Signed)
Interdisciplinary Treatment Plan Update (Adult) Date: 12/30/2015   Date: 12/30/2015 10:22 AM  Progress in Treatment:  Attending groups: Pt is new to milieu, continuing to assess  Participating in groups: Pt is new to milieu, continuing to assess  Taking medication as prescribed: Yes  Tolerating medication: Yes  Family/Significant othe contact made: No, CSW assessing for appropriate contact Patient understands diagnosis: Yes AEB seeking help with depression Discussing patient identified problems/goals with staff: Yes  Medical problems stabilized or resolved: Yes  Denies suicidal/homicidal ideation: Yes Patient has not harmed self or Others: Yes   New problem(s) identified: None identified at this time.   Discharge Plan or Barriers: CSW will assess for appropriate discharge plan and relevant barriers.   Additional comments:  Patient and CSW reviewed pt's identified goals and treatment plan. Patient verbalized understanding and agreed to treatment plan. CSW reviewed [facility] "Discharge Process and Patient Involvement" Form. Pt verbalized understanding of information provided and signed form.   Reason for Continuation of Hospitalization:  Depression Medication stabilization Suicidal ideation  Estimated length of stay: 3-5 days  Review of initial/current patient goals per problem list:   1.  Goal(s): Patient will participate in aftercare plan  Met:  No  Target date: 3-5 days from date of admission   As evidenced by: Patient will participate within aftercare plan AEB aftercare provider and housing plan at discharge being identified.  12/30/15: CSW to work with Pt to assess for appropriate discharge plan and faciliate appointments and referrals as needed prior to d/c.  2.  Goal (s): Patient will exhibit decreased depressive symptoms and suicidal ideations.  Met:  Yes  Target date: 3-5 days from date of admission   As evidenced by: Patient will utilize self rating of depression at 3 or  below and demonstrate decreased signs of depression or be deemed stable for discharge by MD.  12/30/15: Pt rates depression at 0/10; denies SI  Attendees:  Patient:    Family:    Physician: Dr. Parke Poisson, MD  12/30/2015 10:22 AM  Nursing: Lars Pinks, RN Case manager  12/30/2015 10:22 AM  Clinical Social Worker Peri Maris, [place] 12/30/2015 10:22 AM  Other: Tilden Fossa, [place] 12/30/2015 10:22 AM  Clinical: Darrol Angel, RN; Grayland Ormond, RN 12/30/2015 10:22 AM  Other: , RN Charge Nurse 12/30/2015 10:22 AM  Other: Hilda Lias, [place], [place] [phone]

## 2015-12-30 NOTE — Progress Notes (Signed)
Writer spoke with patient at medication window and she was tearful and upset. Writer asked if she was ok and she informed Clinical research associatewriter that she was stuck here in a looney bin and people telling her she can't be on the phone. Writer explained to her the reason for not being on the phone during group time and if staff was rude to her I apologized for this happening. I explained to her the times that the phones could be used and explained to her how our hospital  treats persons with different issues. I explained to her that calling this place a looney bin in front of other patients may offend them and to try and be respectful of individuals issues. She was receptive and was later observed up in the dayroom laughing and talking with select peers. She denies si/hi/a/v hallucinations. She was informed of medications available if needed. Support given and safety maintained on unit with 15 min checks.

## 2015-12-30 NOTE — [facility] Group Notes (Signed)
[facility]  12/30/2015 8:45 AM  Participation Quality: Alert, Appropriate and Oriented  Mood/Affect: Appropriate  Depression Rating: 0  Anxiety Rating: 3-4  Thoughts of Suicide: Pt denies SI/HI  Will you contract for safety? Yes  Current AVH: Pt denies  Plan for Discharge/Comments: Pt attended discharge planning group and actively participated in group. CSW discussed suicide prevention education with the group and encouraged them to discuss discharge planning and any relevant barriers. Pt reports that she is feeling better this morning with no complaints. However, she does say that he anxiety is related to being in the hospital.  Transportation Means: Pt reports access to transportation  Supports: No supports mentioned at this time  Chad CordialLauren Carter, LCSWA 12/30/2015 10:19 AM

## 2015-12-30 NOTE — Progress Notes (Signed)
Adult Psychoeducational Group Note  Date:  12/30/2015 Time:  8:20 PM  Group Topic/Focus:  Wrap-Up Group:   The focus of this group is to help patients review their daily goal of treatment and discuss progress on daily workbooks.  Participation Level:  Active  Participation Quality:  Appropriate  Affect:  Appropriate  Cognitive:  Appropriate  Insight: Appropriate  Engagement in Group:  Engaged  Modes of Intervention:  Discussion  Additional Comments:  Pt rated her overall day a 10 out of 10 because of the positive communication and interaction with other patients on the unit throughout the day. Pt reported that she achieved her goal for the day, which was to eat a full meal.   Cleotilde NeerJasmine S Jaceyon Strole 12/30/2015, 8:55 PM

## 2015-12-30 NOTE — Progress Notes (Signed)
D: Pt was in the day room upon initial approach.  She presents with appropriate affect and pleasant mood.  She reports her day was "10 out of 10, been wonderful."  Pt reports she discharges tomorrow and she feels safe to do so.  Pt states "I would like to get a good night of sleep."  Pt reports her aftercare plan is to follow up with her primary care provider.  She denies SI/HI, denies hallucinations, denies pain.  She reports she would like to be prescribed Diflucan because she thinks she has a yeast infection.  Pt has been visible in milieu interacting with peers and staff appropriately.  She attended evening group.    A: Introduced self to pt.  Actively listened to pt and offered support and encouragement.  PRN medication administered for sleep.  Medication administered per order.  Q15 minute safety checks performed for safety.    R: Pt is compliant with medications.  She verbally contracts for safety and reports she will inform staff of needs and concerns.  Will continue to monitor and assess.
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
## 2015-12-31 LAB — GC/CHLAMYDIA PROBE AMP (~~LOC~~) NOT AT ARMC
CHLAMYDIA, DNA PROBE: NEGATIVE
Neisseria Gonorrhea: NEGATIVE

## 2015-12-31 MED ORDER — NICOTINE 21 MG/24HR TD PT24: 21.0000 mg | MEDICATED_PATCH | Freq: Every day | TRANSDERMAL | Status: DC

## 2015-12-31 MED ORDER — FLUOXETINE HCL 20 MG PO CAPS: 20.0000 mg | ORAL_CAPSULE | Freq: Every day | ORAL | Status: DC

## 2015-12-31 MED ORDER — TRAZODONE HCL 50 MG PO TABS: 50.0000 mg | ORAL_TABLET | Freq: Every evening | ORAL | Status: DC | PRN

## 2015-12-31 MED ORDER — METRONIDAZOLE 500 MG PO TABS: 500.0000 mg | ORAL_TABLET | Freq: Two times a day (BID) | ORAL | Status: DC

## 2015-12-31 NOTE — [facility] Suicide Risk Assessment (Signed)
[facility] INPATIENT:  Family/Significant Other Suicide Prevention Education  Suicide Prevention Education:  Contact Attempts: Melanie BreedingRyan Serrano, Pt's husband [phone], has been identified by the patient as the family member/significant other with whom the patient will be residing, and identified as the person(s) who will aid the patient in the event of a mental health crisis.  With written consent from the patient, two attempts were made to provide suicide prevention education, prior to and/or following the patient's discharge.  We were unsuccessful in providing suicide prevention education.  A suicide education pamphlet was given to the patient to share with family/significant other.  Date and time of first attempt: 12/31/2015 @ 9:20am Date and time of second attempt: 12/31/2015 @ 10:50am  [name], [name] M 12/31/2015, 10:50 AM

## 2015-12-31 NOTE — Progress Notes (Signed)
  [facility] :  Will you be returning to the same living situation after discharge:  Yes,  Pt returning home with family At discharge, do you have transportation home?: Yes,  Pt husband to pick up Do you have the ability to pay for your medications: Yes,  Pt provided with prescriptions  Release of information consent forms completed and in the chart;  Patient's signature needed at discharge.  Patient to Follow up at: Follow-up Information    Follow up with Pt prefers to schedule with her PCP as well as a therapist. CSW provided Pt with list of providers..      Next level of care provider [facility] Link:no  Safety Planning and Suicide Prevention discussed: Yes,  with Pt; unsuccessful contact attempts with husband  Have you used any form of tobacco in the last 30 days? (Cigarettes, Smokeless Tobacco, Cigars, and/or Pipes): No  Has patient been referred to the Quitline?: N/A patient is not a smoker  Patient has been referred for addiction treatment: N/A  Elaina HoopsCarter, Elpidia Karn M 12/31/2015, 10:55 AM

## 2015-12-31 NOTE — Tx Team (Signed)
Interdisciplinary Treatment Plan Update (Adult) Date: 12/31/2015   Date: 12/31/2015 10:53 AM  Progress in Treatment:  Attending groups: Yes  Participating in groups: Yes Taking medication as prescribed: Yes  Tolerating medication: Yes  Family/Significant othe contact made: No, unsuccessful contact attempts made with husband Patient understands diagnosis: Yes AEB seeking help with depression Discussing patient identified problems/goals with staff: Yes  Medical problems stabilized or resolved: Yes  Denies suicidal/homicidal ideation: Yes Patient has not harmed self or Others: Yes   New problem(s) identified: None identified at this time.   Discharge Plan or Barriers: CSW will assess for appropriate discharge plan and relevant barriers.   12/31/2015: Pt will return home; prefers to schedule her own follow-up appointments.  Additional comments:  Patient and CSW reviewed pt's identified goals and treatment plan. Patient verbalized understanding and agreed to treatment plan. CSW reviewed [facility] "Discharge Process and Patient Involvement" Form. Pt verbalized understanding of information provided and signed form.   Reason for Continuation of Hospitalization:  Depression Medication stabilization Suicidal ideation  Estimated length of stay: 0 days  Review of initial/current patient goals per problem list:   1.  Goal(s): Patient will participate in aftercare plan  Met:  Yes  Target date: 3-5 days from date of admission   As evidenced by: Patient will participate within aftercare plan AEB aftercare provider and housing plan at discharge being identified.  12/30/15: CSW to work with Pt to assess for appropriate discharge plan and faciliate appointments and referrals as needed prior to d/c. 12/31/2015: Pt will return home; prefers to schedule her own follow-up appointments.  2.  Goal (s): Patient will exhibit decreased depressive symptoms and suicidal ideations.  Met:  Yes  Target date: 3-5  days from date of admission   As evidenced by: Patient will utilize self rating of depression at 3 or below and demonstrate decreased signs of depression or be deemed stable for discharge by MD.  12/30/15: Pt rates depression at 0/10; denies SI  Attendees:  Patient:    Family:    Physician: Dr. Parke Poisson, MD  12/31/2015 10:53 AM  Nursing: Lars Pinks, RN Case manager  12/31/2015 10:53 AM  Clinical Social Worker Peri Maris, [place] 12/31/2015 10:53 AM  Other: Tilden Fossa, [place] 12/31/2015 10:53 AM  Clinical: Darrol Angel, RN; Marcella Dubs, RN 12/31/2015 10:53 AM  Other: , RN Charge Nurse 12/31/2015 10:53 AM  Other: Hilda Lias, [place], [place] Work [phone]

## 2015-12-31 NOTE — Discharge Summary (Signed)
Physician Discharge Summary Note  Patient:  Melanie Serrano is an 25 y.o., female MRN:  [id number] DOB:  07/11/1991 Patient phone:  [phone] Melanie Serrano(home)  Patient address:   [address]  [place] [id number],  Total Time spent with patient: 30 minutes  Date of Admission:  12/29/2015 Date of Discharge: 12/31/2015   Reason for Admission:    Principal Problem: MDD (major depressive disorder) [facility](HCC) Discharge Diagnoses: Patient Active Problem List   Diagnosis Date Noted  . MDD (major depressive disorder) (HCC) [F32.9] 12/29/2015  . MDD (major depressive disorder), recurrent severe, without psychosis (HCC) [F33.2] 12/29/2015    Past Psychiatric History: see HPI  Past Medical History:  Past Medical History  Diagnosis Date  . Depression    History reviewed. No pertinent past surgical history. Family History: History reviewed. No pertinent family history. Family Psychiatric  History: see HPI Social History:  History  Alcohol Use No     History  Drug Use Not on file    Social History   Social History  . Marital Status: Married    Spouse Name: N/A  . Number of Children: N/A  . Years of Education: N/A   Social History Main Topics  . Smoking status: Current Every Day Smoker  . Smokeless tobacco: None  . Alcohol Use: No  . Drug Use: None  . Sexual Activity: Yes    Birth Control/ Protection: None   Other Topics Concern  . None   Social History Narrative    Hospital Course:  Melanie KaBrandy Serrano is 25 year old married female. Reported history of depression. She reported she has been struggling with depression and has been feeling increasingly stressed recently. Presented to ED with her family following overdose on OTCs ( Acetaminophen and NSAID).    Melanie Serrano was admitted for MDD (major depressive disorder) (HCC) and crisis management.  She was treated with medications and indication listed below.  Medical problems were identified and treated as needed.  Home medications  were restarted as appropriate.  Improvement was monitored by observation and Melanie Serrano daily report of symptom reduction.  Emotional and mental status was monitored by daily self inventory reports completed by Melanie Serrano and clinical staff.  Patient reported continued improvement, denied any new concerns.  Patient had been compliant on medications and denied side effects.  Support and encouragement was provided.    Patient did well during inpatient stay.  At time of discharge, patient rated both depression and anxiety levels to be manageable and minimal.  Patient encouraged to identify the triggers of emotional crises and de-stabilizations.  Patient encouraged to attend group work to learn to identify the positive things in life that would help in dealing with feelings of loss, depression and unhealthy or abusive tendencies.         Melanie Serrano was evaluated by the treatment team for stability and plans for continued recovery upon discharge.  She was offered further treatment options upon discharge including Residential, Intensive Outpatient and Outpatient treatment. She will follow up with agencies listed below for medication management and counseling.  Encouraged patient to maintain satisfactory support network and home environment.  Advised to adhere to medication compliance and outpatient treatment follow up.      Melanie Serrano motivation was an integral factor for scheduling further treatment.  Employment, transportation, bed availability, health status, family support, and any pending legal issues were also considered during her hospital stay.  Upon completion of this admission the patient was both mentally and medically stable  for discharge denying suicidal/homicidal ideation, auditory/visual/tactile hallucinations, delusional thoughts and paranoia.      Physical Findings: AIMS: Facial and Oral Movements Muscles of Facial Expression: None, normal Lips and Perioral Area: None,  normal Jaw: None, normal Tongue: None, normal,Extremity Movements Upper (arms, wrists, hands, fingers): None, normal Lower (legs, knees, ankles, toes): None, normal, Trunk Movements Neck, shoulders, hips: None, normal, Overall Severity Severity of abnormal movements (highest score from questions above): None, normal Incapacitation due to abnormal movements: None, normal Patient's awareness of abnormal movements (rate only patient's report): No Awareness, Dental Status Current problems with teeth and/or dentures?: No Does patient usually wear dentures?: No  CIWA:  CIWA-Ar Total: 2 COWS:  COWS Total Score: 2  Musculoskeletal: Strength & Muscle Tone: within normal limits Gait & Station: normal Patient leans: N/A  Psychiatric Specialty Exam: Physical Exam  Psychiatric: Thought content is not delusional. Depressed: improving. She expresses no homicidal and no suicidal ideation.    Review of Systems  Psychiatric/Behavioral: Negative for suicidal ideas, hallucinations and substance abuse. The patient is not nervous/anxious.   All other systems reviewed and are negative.   Blood pressure 160/93, pulse 105, temperature 98.2 F (36.8 C), temperature source Oral, resp. rate 16, height 5' 3.75" (1.619 m), weight 63.504 kg (140 lb), last menstrual period 12/15/2015, SpO2 100 %.Body mass index is 24.23 kg/(m^2).   Have you used any form of tobacco in the last 30 days? (Cigarettes, Smokeless Tobacco, Cigars, and/or Pipes): No  Has this patient used any form of tobacco in the last 30 days? (Cigarettes, Smokeless Tobacco, Cigars, and/or Pipes) Yes, No  Blood Alcohol level:  Lab Results  Component Value Date   ETH <5 12/28/2015    Metabolic Disorder Labs:  No results found for: HGBA1C, MPG No results found for: PROLACTIN No results found for: CHOL, TRIG, HDL, CHOLHDL, VLDL, LDLCALC  See Psychiatric Specialty Exam and Suicide Risk Assessment completed by Attending Physician prior to  discharge.  Discharge destination:  Home  Is patient on multiple antipsychotic therapies at discharge:  No   Has Patient had three or more failed trials of antipsychotic monotherapy by history:  No  Recommended Plan for Multiple Antipsychotic Therapies: NA     Medication List    STOP taking these medications        prenatal multivitamin Tabs tablet     sertraline 25 MG tablet  Commonly known as:  ZOLOFT      TAKE these medications      Indication   FLUoxetine 20 MG capsule  Commonly known as:  PROZAC  Take 1 capsule (20 mg total) by mouth daily.   Indication:  Major Depressive Disorder     metroNIDAZOLE 500 MG tablet  Commonly known as:  FLAGYL  Take 1 tablet (500 mg total) by mouth every 12 (twelve) hours.   Indication:  Vaginosis caused by Bacteria     nicotine 21 mg/24hr patch  Commonly known as:  NICODERM CQ - dosed in mg/24 hours  Place 1 patch (21 mg total) onto the skin daily.   Indication:  Nicotine Addiction     traZODone 50 MG tablet  Commonly known as:  DESYREL  Take 1 tablet (50 mg total) by mouth at bedtime as needed for sleep.   Indication:  Trouble Sleeping           Follow-up Information    Follow up with Pt prefers to schedule with her PCP as well as a therapist. CSW provided Pt with list of  providers..      Follow-up recommendations:  Activity:  as tol Diet:  as tol  Comments:  1.  Take all your medications as prescribed.   2.  Report any adverse side effects to outpatient provider. 3.  Patient instructed to not use alcohol or illegal drugs while on prescription medicines. 4.  In the event of worsening symptoms, instructed patient to call 911, the crisis hotline or go to nearest emergency room for evaluation of symptoms.  Signed: Lindwood Qua, NP [facility] 12/31/2015, 1:39 PM   Patient seen, Suicide Assessment Completed.  Disposition Plan Reviewed

## 2015-12-31 NOTE — Progress Notes (Signed)
Discharge note: Pt received both written and verbal discharge instructions. Pt verbalized understanding of discharge instructions. Pt agreed to f/u appt and med regimen. Pt received prescriptions, SRA, Transition record and AVS. Pt received belongings from room and locker. Pt safely discharged to lobby.   

## 2015-12-31 NOTE — Plan of Care (Signed)
Problem: Coping: Goal: Ability to interact with others will improve Outcome: Progressing Pt has interacted appropriately with peers and staff tonight
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
## 2015-12-31 NOTE — Progress Notes (Signed)
Adult Psychoeducational Group Note  Date:  12/31/2015 Time:  0830  Group Topic/Focus:  Orientation:   The focus of this group is to educate the patient on the purpose and policies of crisis stabilization and provide a format to answer questions about their admission.  The group details unit policies and expectations of patients while admitted.  Participation Level:  Active  Participation Quality:  Appropriate  Affect:  Appropriate  Cognitive:  Appropriate  Insight: Appropriate  Engagement in Group:  Engaged  Modes of Intervention:  Discussion and Orientation  Additional Comments:    Deronte Solis L 12/31/2015, 12:53 PM

## 2016-04-23 ENCOUNTER — Encounter: Payer: Self-pay | Admitting: Obstetrics

## 2016-04-23 ENCOUNTER — Ambulatory Visit (INDEPENDENT_AMBULATORY_CARE_PROVIDER_SITE_OTHER): Payer: 59 | Admitting: Obstetrics

## 2016-04-23 VITALS — BP 113/69 | HR 93 | Wt 138.0 lb

## 2016-04-23 DIAGNOSIS — N390 Urinary tract infection, site not specified: Secondary | ICD-10-CM | POA: Diagnosis not present

## 2016-04-23 DIAGNOSIS — Z3491 Encounter for supervision of normal pregnancy, unspecified, first trimester: Secondary | ICD-10-CM

## 2016-04-23 DIAGNOSIS — O219 Vomiting of pregnancy, unspecified: Secondary | ICD-10-CM

## 2016-04-23 MED ORDER — METOCLOPRAMIDE HCL 10 MG PO TABS: 10.0000 mg | ORAL_TABLET | Freq: Three times a day (TID) | ORAL | 2 refills | Status: DC

## 2016-04-23 MED ORDER — AMOXICILLIN-POT CLAVULANATE 875-125 MG PO TABS: 1.0000 | ORAL_TABLET | Freq: Two times a day (BID) | ORAL | 0 refills | Status: DC

## 2016-04-23 NOTE — Progress Notes (Signed)
Subjective:    Melanie Serrano is being seen today for her first obstetrical visit.  This is not a planned pregnancy. She is at [redacted]w[redacted]d gestation. Her obstetrical history is significant for smoker. Relationship with FOB: significant other, not living together. Patient does intend to breast feed. Pregnancy history fully reviewed.  The information documented in the HPI was reviewed and verified.  Menstrual History: OB History    Gravida Para Term Preterm AB Living   3 1 1   1 1    SAB TAB Ectopic Multiple Live Births   1       1       Patient's last menstrual period was 02/10/2016.    Past Medical History:  Diagnosis Date  . Depression     Past Surgical History:  Procedure Laterality Date  . WISDOM TOOTH EXTRACTION       (Not in a hospital admission) No Known Allergies  Social History  Substance Use Topics  . Smoking status: Current Every Day Smoker  . Smokeless tobacco: Never Used     Comment: 1/2 pack/day  . Alcohol use No    Family History  Problem Relation Age of Onset  . Cancer Father   . Hypertension Father   . Melanoma Father   . COPD Mother   . Hypertension Mother   . Heart attack Maternal Grandfather   . Cancer Paternal Grandmother   . Diabetes Paternal Grandfather      Review of Systems Constitutional: negative for weight loss Gastrointestinal: negative for vomiting Genitourinary:negative for genital lesions and vaginal discharge and dysuria Musculoskeletal:negative for back pain Behavioral/Psych: negative for abusive relationship, depression, illegal drug usage and tobacco use    Objective:    BP 113/69   Pulse 93   Wt 138 lb (62.6 kg)   LMP 02/10/2016   BMI 23.87 kg/m  General Appearance:    Alert, cooperative, no distress, appears stated age  Head:    Normocephalic, without obvious abnormality, atraumatic  Eyes:    PERRL, conjunctiva/corneas clear, EOM's intact, fundi    benign, both eyes  Ears:    Normal TM's and external ear canals, both ears   Nose:   Nares normal, septum midline, mucosa normal, no drainage    or sinus tenderness  Throat:   Lips, mucosa, and tongue normal; teeth and gums normal  Neck:   Supple, symmetrical, trachea midline, no adenopathy;    thyroid:  no enlargement/tenderness/nodules; no carotid   bruit or JVD  Back:     Symmetric, no curvature, ROM normal, no CVA tenderness  Lungs:     Clear to auscultation bilaterally, respirations unlabored  Chest Wall:    No tenderness or deformity   Heart:    Regular rate and rhythm, S1 and S2 normal, no murmur, rub   or gallop  Breast Exam:    No tenderness, masses, or nipple abnormality  Abdomen:     Soft, non-tender, bowel sounds active all four quadrants,    no masses, no organomegaly  Genitalia:    Normal female without lesion, discharge or tenderness  Extremities:   Extremities normal, atraumatic, no cyanosis or edema  Pulses:   2+ and symmetric all extremities  Skin:   Skin color, texture, turgor normal, no rashes or lesions  Lymph nodes:   Cervical, supraclavicular, and axillary nodes normal  Neurologic:   CNII-XII intact, normal strength, sensation and reflexes    throughout      Lab Review Urine pregnancy test Labs reviewed yes Radiologic  studies reviewed no Assessment:    Pregnancy at 7083w3d weeks    Plan:      Prenatal vitamins.  Counseling provided regarding continued use of seat belts, cessation of alcohol consumption, smoking or use of illicit drugs; infection precautions i.e., influenza/TDAP immunizations, toxoplasmosis,CMV, parvovirus, listeria and varicella; workplace safety, exercise during pregnancy; routine dental care, safe medications, sexual activity, hot tubs, saunas, pools, travel, caffeine use, fish and methlymercury, potential toxins, hair treatments, varicose veins Weight gain recommendations per IOM guidelines reviewed: underweight/BMI< 18.5--> gain 28 - 40 lbs; normal weight/BMI 18.5 - 24.9--> gain 25 - 35 lbs; overweight/BMI 25 -  29.9--> gain 15 - 25 lbs; obese/BMI >30->gain  11 - 20 lbs Problem list reviewed and updated. FIRST/CF mutation testing/NIPT/QUAD SCREEN/fragile X/Ashkenazi Jewish population testing/Spinal muscular atrophy discussed: requested. Role of ultrasound in pregnancy discussed; fetal survey: requested. Amniocentesis discussed: not indicated. VBAC calculator score: VBAC consent form provided Meds ordered this encounter  Medications  . Doxylamine-Pyridoxine (DICLEGIS) 10-10 MG TBEC    Sig: Take by mouth.  . metoCLOPramide (REGLAN) 10 MG tablet    Sig: Take 1 tablet (10 mg total) by mouth 4 (four) times daily -  before meals and at bedtime.    Dispense:  90 tablet    Refill:  2  . amoxicillin-clavulanate (AUGMENTIN) 875-125 MG tablet    Sig: Take 1 tablet by mouth 2 (two) times daily.    Dispense:  14 tablet    Refill:  0   No orders of the defined types were placed in this encounter.   Follow up in 4 weeks. 20% of 50 min visit spent on counseling and coordination of care. Patient ID: Melanie Serrano, female   DOB: [birth year], 25 y.o.   MRN: [id number]

## 2016-05-05 ENCOUNTER — Emergency Department (HOSPITAL_COMMUNITY)
Admission: EM | Admit: 2016-05-05 | Discharge: 2016-05-05 | Disposition: A | Discharge status: Home / Self Care | Payer: 59 | Attending: Emergency Medicine | Admitting: Emergency Medicine

## 2016-05-05 ENCOUNTER — Encounter (HOSPITAL_COMMUNITY): Payer: Self-pay | Admitting: Emergency Medicine

## 2016-05-05 ENCOUNTER — Telehealth: Payer: Self-pay | Admitting: *Deleted

## 2016-05-05 DIAGNOSIS — O99331 Smoking (tobacco) complicating pregnancy, first trimester: Secondary | ICD-10-CM | POA: Insufficient documentation

## 2016-05-05 DIAGNOSIS — F17210 Nicotine dependence, cigarettes, uncomplicated: Secondary | ICD-10-CM | POA: Diagnosis not present

## 2016-05-05 DIAGNOSIS — Z3A12 12 weeks gestation of pregnancy: Secondary | ICD-10-CM | POA: Insufficient documentation

## 2016-05-05 DIAGNOSIS — O0281 Inappropriate change in quantitative human chorionic gonadotropin (hCG) in early pregnancy: Secondary | ICD-10-CM | POA: Diagnosis not present

## 2016-05-05 DIAGNOSIS — O210 Mild hyperemesis gravidarum: Secondary | ICD-10-CM | POA: Insufficient documentation

## 2016-05-05 LAB — COMPREHENSIVE METABOLIC PANEL
ALBUMIN: 4.2 g/dL (ref 3.5–5.0)
ALK PHOS: 51 U/L (ref 38–126)
ALT: 23 U/L (ref 14–54)
AST: 24 U/L (ref 15–41)
Anion gap: 10 (ref 5–15)
BILIRUBIN TOTAL: 0.7 mg/dL (ref 0.3–1.2)
BUN: 8 mg/dL (ref 6–20)
CALCIUM: 9.8 mg/dL (ref 8.9–10.3)
CO2: 23 mmol/L (ref 22–32)
CREATININE: 0.57 mg/dL (ref 0.44–1.00)
Chloride: 103 mmol/L (ref 101–111)
GFR calc Af Amer: >60 mL/min (ref >60)
GLUCOSE: 85 mg/dL (ref 65–99)
Potassium: 4.1 mmol/L (ref 3.5–5.1)
Sodium: 136 mmol/L (ref 135–145)
TOTAL PROTEIN: 7.6 g/dL (ref 6.5–8.1)

## 2016-05-05 LAB — URINALYSIS, ROUTINE W REFLEX MICROSCOPIC
Glucose, UA: NEGATIVE mg/dL
HGB URINE DIPSTICK: NEGATIVE
Ketones, ur: >80 mg/dL — AB
Nitrite: NEGATIVE
PH: 6 (ref 5.0–8.0)
Protein, ur: NEGATIVE mg/dL
SPECIFIC GRAVITY, URINE: 1.034 — AB (ref 1.005–1.030)

## 2016-05-05 LAB — URINE MICROSCOPIC-ADD ON: RBC / HPF: NONE SEEN RBC/hpf (ref 0–5)

## 2016-05-05 LAB — CBC
HEMATOCRIT: 42.3 % (ref 36.0–46.0)
Hemoglobin: 15.5 g/dL — ABNORMAL HIGH (ref 12.0–15.0)
MCH: 31.7 pg (ref 26.0–34.0)
MCHC: 36.6 g/dL — AB (ref 30.0–36.0)
MCV: 86.5 fL (ref 78.0–100.0)
PLATELETS: 202 10*3/uL (ref 150–400)
RBC: 4.89 MIL/uL (ref 3.87–5.11)
RDW: 12.7 % (ref 11.5–15.5)
WBC: 10.2 10*3/uL (ref 4.0–10.5)

## 2016-05-05 LAB — LIPASE, BLOOD: Lipase: 22 U/L (ref 11–51)

## 2016-05-05 LAB — HCG, QUANTITATIVE, PREGNANCY: hCG, Beta Chain, Quant, S: 58566 m[IU]/mL — ABNORMAL HIGH (ref <5)

## 2016-05-05 MED ORDER — SODIUM CHLORIDE 0.9 % IV BOLUS (SEPSIS)
2000.0000 mL | Freq: Once | INTRAVENOUS | Status: AC
  Administered 2016-05-05: 2000 mL via INTRAVENOUS

## 2016-05-05 MED ORDER — PROMETHAZINE HCL 25 MG PO TABS: 25.0000 mg | ORAL_TABLET | Freq: Four times a day (QID) | ORAL | 0 refills | Status: DC | PRN

## 2016-05-05 MED ORDER — PROMETHAZINE HCL 25 MG/ML IJ SOLN
12.5000 mg | Freq: Once | INTRAMUSCULAR | Status: AC
  Administered 2016-05-05: 12.5 mg via INTRAVENOUS
  Filled 2016-05-05: qty 1

## 2016-05-05 NOTE — ED Notes (Signed)
PA at bedside.

## 2016-05-05 NOTE — ED Triage Notes (Signed)
Pt reports persistent N/V over last 7 weeks due to current pregnancy. Dx with UTI , but unable to take medication. C/o dehydration and pain on urination

## 2016-05-05 NOTE — Telephone Encounter (Signed)
Patient reports nausea and vomiting to the point that she can not keep anything down. She is beginning to get depressed. 1:37 Call received- patient has already gone to ED.

## 2016-05-06 ENCOUNTER — Inpatient Hospital Stay (HOSPITAL_COMMUNITY)
Admission: AD | Admit: 2016-05-06 | Discharge: 2016-05-06 | Disposition: A | Discharge status: Home / Self Care | Payer: 59 | Source: Ambulatory Visit | Attending: Obstetrics and Gynecology | Admitting: Obstetrics and Gynecology

## 2016-05-06 ENCOUNTER — Encounter (HOSPITAL_COMMUNITY): Payer: Self-pay

## 2016-05-06 DIAGNOSIS — O210 Mild hyperemesis gravidarum: Secondary | ICD-10-CM | POA: Diagnosis present

## 2016-05-06 DIAGNOSIS — Z3A12 12 weeks gestation of pregnancy: Secondary | ICD-10-CM | POA: Diagnosis not present

## 2016-05-06 LAB — URINALYSIS, ROUTINE W REFLEX MICROSCOPIC
Bilirubin Urine: NEGATIVE
Glucose, UA: NEGATIVE mg/dL
Hgb urine dipstick: NEGATIVE
Ketones, ur: >80 mg/dL — AB
Leukocytes, UA: NEGATIVE
Nitrite: NEGATIVE
Protein, ur: NEGATIVE mg/dL
Specific Gravity, Urine: >1.03 — ABNORMAL HIGH (ref 1.005–1.030)
pH: 5.5 (ref 5.0–8.0)

## 2016-05-06 MED ORDER — METOCLOPRAMIDE HCL 5 MG/ML IJ SOLN
10.0000 mg | Freq: Once | INTRAMUSCULAR | Status: AC
  Administered 2016-05-06: 10 mg via INTRAVENOUS
  Filled 2016-05-06: qty 2

## 2016-05-06 MED ORDER — FAMOTIDINE IN NACL 20-0.9 MG/50ML-% IV SOLN
20.0000 mg | Freq: Once | INTRAVENOUS | Status: AC
  Administered 2016-05-06: 20 mg via INTRAVENOUS
  Filled 2016-05-06: qty 50

## 2016-05-06 MED ORDER — M.V.I. ADULT IV INJ
Freq: Once | INTRAVENOUS | Status: AC
  Administered 2016-05-06: 18:00:00 via INTRAVENOUS
  Filled 2016-05-06: qty 1000

## 2016-05-06 MED ORDER — ONDANSETRON 4 MG PO TBDP: 4.0000 mg | ORAL_TABLET | Freq: Three times a day (TID) | ORAL | 1 refills | Status: DC | PRN

## 2016-05-06 MED ORDER — ONDANSETRON 4 MG PO TBDP
4.0000 mg | ORAL_TABLET | Freq: Once | ORAL | Status: AC
  Administered 2016-05-06: 4 mg via ORAL
  Filled 2016-05-06: qty 1

## 2016-05-06 MED ORDER — PROMETHAZINE HCL 25 MG RE SUPP: 25.0000 mg | Freq: Four times a day (QID) | RECTAL | 1 refills | Status: DC | PRN

## 2016-05-06 MED ORDER — DEXTROSE 5 % IN LACTATED RINGERS IV BOLUS
1000.0000 mL | Freq: Once | INTRAVENOUS | Status: AC
  Administered 2016-05-06: 1000 mL via INTRAVENOUS

## 2016-05-06 NOTE — MAU Note (Signed)
Pt has been having nausea and vomiting since 5w, she is now 1669w2d.  States she is vomiting 7 times a day.  She says every time she eats it comes back up. She feels like she can't take care of her 32six year old because she is so sick all of the time. Denies lof/vb/cramping.

## 2016-05-06 NOTE — ED Provider Notes (Signed)
[facility]-EMERGENCY DEPT Provider Note   CSN: [id number] Arrival date & time: 05/05/16  1314     History   Chief Complaint Chief Complaint  Patient presents with  . Emesis    [redacted] weeks pregnant    HPI Melanie Serrano is a 25 y.o. female.  HPI Patient presents to the emergency department with nausea, vomiting over the past few weeks.  She is [redacted] weeks pregnant and states that over the past few weeks for nausea, vomiting, increased.  Patient states she has been given Reglan at home without significant relief of her symptomsThe patient denies chest pain, shortness of breath, headache,blurred vision, neck pain, fever, cough, weakness, numbness, dizziness, anorexia, edema, abdominal pain, diarrhea, rash, back pain, dysuria, hematemesis, bloody stool, near syncope, or syncope. Past Medical History:  Diagnosis Date  . Depression     Patient Active Problem List   Diagnosis Date Noted  . MDD (major depressive disorder) 12/29/2015  . 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 WISDOM TOOTH EXTRACTION      OB History    Gravida Para Term Preterm AB Living   3 1 1   1 1    SAB TAB Ectopic Multiple Live Births   1       1       Home Medications    Prior to Admission medications   Medication Sig Start Date End Date Taking? Authorizing Provider  Doxylamine-Pyridoxine (DICLEGIS) 10-10 MG TBEC Take 1 tablet by mouth daily as needed (nausea).    Yes Historical Provider, MD  metoCLOPramide (REGLAN) 10 MG tablet Take 1 tablet (10 mg total) by mouth 4 (four) times daily -  before meals and at bedtime. Patient taking differently: Take 10 mg by mouth 4 (four) times daily as needed for nausea or vomiting.  04/23/16  Yes Brock Bad, MD  amoxicillin-clavulanate (AUGMENTIN) 875-125 MG tablet Take 1 tablet by mouth 2 (two) times daily. Patient not taking: Reported on 05/05/2016 04/23/16   Brock Bad, MD  promethazine (PHENERGAN) 25 MG tablet Take 1 tablet (25 mg total) by mouth every 6 (six) hours as needed for nausea or vomiting. 05/05/16   Charlestine Night, PA-C    Family History Family History  Problem Relation Age of Onset  . Cancer Father   . Hypertension Father   . Melanoma Father   . COPD Mother   . Hypertension Mother   . Heart attack Maternal Grandfather   . Cancer Paternal Grandmother   . Diabetes Paternal Grandfather     Social History Social History  Substance Use Topics  . Smoking status: Current Every Day Smoker    Types: Cigarettes  . Smokeless tobacco: Never Used     Comment: 1/2 pack/day  . Alcohol use No     Allergies   Shellfish allergy   Review of Systems Review of Systems All other systems negative except as documented in the HPI. All pertinent positives and negatives as reviewed in the HPI.  Physical Exam Updated Vital Signs BP 102/59 (BP Location: Left Arm)   Pulse 84   Temp 98.9 F (37.2 C) (Oral)   Resp 15   Wt 62.6 kg   LMP 02/10/2016   SpO2 98%   BMI 23.87 kg/m   Physical Exam  Constitutional: She is oriented to person, place, and time. She appears well-developed and well-nourished. No distress.  HENT:  Head: Normocephalic and atraumatic.  Mouth/Throat: Oropharynx is clear and moist.  Eyes: Pupils are equal, round, and reactive to light.  Neck: Normal range of motion. Neck supple.  Cardiovascular: Normal rate, regular rhythm and normal heart sounds.  Exam reveals no gallop and no friction rub.   No murmur heard. Pulmonary/Chest: Effort normal and breath sounds normal. No respiratory distress. She has no wheezes.  Abdominal: Soft. Bowel sounds are normal. She exhibits no distension. There is no tenderness. There is no guarding.  Neurological: She is alert and oriented to person, place, and time. She exhibits normal muscle tone. Coordination normal.  Skin: Skin is warm and dry. No rash noted. No erythema.    Psychiatric: She has a normal mood and affect. Her behavior is normal.  Nursing note and vitals reviewed.    ED Treatments / Results  Labs (all labs ordered are listed, but only abnormal results are displayed) Labs Reviewed  URINALYSIS, ROUTINE W REFLEX MICROSCOPIC (NOT AT [facility]) - Abnormal; Notable for the following:       Result Value   Color, Urine AMBER (*)    APPearance CLOUDY (*)    Specific Gravity, Urine 1.034 (*)    Bilirubin Urine SMALL (*)    Ketones, ur >80 (*)    Leukocytes, UA SMALL (*)    All other components within normal limits  CBC - Abnormal; Notable for the following:    Hemoglobin 15.5 (*)    MCHC 36.6 (*)    All other components within normal limits  HCG, QUANTITATIVE, PREGNANCY - Abnormal; Notable for the following:    hCG, Beta Chain, Quant, S 58,566 (*)    All other components within normal limits  URINE MICROSCOPIC-ADD ON - Abnormal; Notable for the following:    Squamous Epithelial / LPF 6-30 (*)    Bacteria, UA MANY (*)    All other components within normal limits  LIPASE, BLOOD  COMPREHENSIVE METABOLIC PANEL    EKG  EKG Interpretation None       Radiology No results found.  Procedures Procedures (including critical care time)  Medications Ordered in ED Medications  sodium chloride 0.9 % bolus 2,000 mL (0 mLs Intravenous Stopped 05/05/16 2150)  promethazine (PHENERGAN) injection 12.5 mg (12.5 mg Intravenous Given 05/05/16 1939)     Initial Impression / Assessment and Plan / ED Course  I have reviewed the triage vital signs and the nursing notes.  Pertinent labs & imaging results that were available during my care of the patient were reviewed by me and considered in my medical decision making (see chart for details).  Clinical Course    Patient is given 2 L of IV fluids due to the fact she stay hydrated.  The patient will be advised to follow-up with her GYN doctor.  Patient is advised to increase her fluid intake slowly.  Patient  agrees the plan and all questions were answered  Final Clinical Impressions(s) / ED Diagnoses   Final diagnoses:  Hyperemesis gravidarum    New Prescriptions Discharge Medication List as of 05/05/2016 10:29 PM    START taking these medications   Details  promethazine (PHENERGAN) 25 MG tablet Take 1 tablet (25 mg total) by mouth every 6 (six) hours as needed for nausea or vomiting., Starting Tue 05/05/2016, Print         Charlestine Nighthristopher Fernado Brigante, PA-C 05/06/16 0149    Lorre NickAnthony Allen, MD 05/07/16 [phone]

## 2016-05-06 NOTE — MAU Provider Note (Signed)
History     CSN: [id number]  Arrival date and time: 05/06/16 1247   None     Chief Complaint  Patient presents with  . Emesis   HPI   [name] is a 25 y.o.female G3P1011 @ [redacted]w[redacted]d here with N/V. She was seen yesterday at [facility] with the same symptoms and does not feel she is any better today. She was given an RX for phenergan, however has not felt well enough to get the Rx filled. She feels she is dehydrated and needs IVF.   OB History    Gravida Para Term Preterm AB Living   3 1 1   1 1    SAB TAB Ectopic Multiple Live Births   1       1      Past Medical History:  Diagnosis Date  . Depression     Past Surgical History:  Procedure Laterality Date  . WISDOM TOOTH EXTRACTION      Family History  Problem Relation Age of Onset  . Cancer Father   . Hypertension Father   . Melanoma Father   . COPD Mother   . Hypertension Mother   . Heart attack Maternal Grandfather   . Cancer Paternal Grandmother   . Diabetes Paternal Grandfather     Social History  Substance Use Topics  . Smoking status: Current Every Day Smoker    Types: Cigarettes  . Smokeless tobacco: Never Used     Comment: 1/2 pack/day  . Alcohol use No    Allergies:  Allergies  Allergen Reactions  . Shellfish Allergy Itching and Nausea And Vomiting    Prescriptions Prior to Admission  Medication Sig Dispense Refill Last Dose  . amoxicillin-clavulanate (AUGMENTIN) 875-125 MG tablet Take 1 tablet by mouth 2 (two) times daily. (Patient not taking: Reported on 05/05/2016) 14 tablet 0 Not Taking at Unknown time  . Doxylamine-Pyridoxine (DICLEGIS) 10-10 MG TBEC Take 1 tablet by mouth daily as needed (nausea).    Past Week at Unknown time  . metoCLOPramide (REGLAN) 10 MG tablet Take 1 tablet (10 mg total) by mouth 4 (four) times daily -  before meals and at bedtime. (Patient taking differently: Take 10 mg by mouth 4 (four) times daily as needed for nausea or vomiting. ) 90 tablet 2 Past Week at  Unknown time  . promethazine (PHENERGAN) 25 MG tablet Take 1 tablet (25 mg total) by mouth every 6 (six) hours as needed for nausea or vomiting. 10 tablet 0    Results for orders placed or performed during the hospital encounter of 05/06/16 (from the past 24 hour(s))  Urinalysis, Routine w reflex microscopic (not at [facility])     Status: Abnormal   Collection Time: 05/06/16  2:30 PM  Result Value Ref Range   Color, Urine YELLOW YELLOW   APPearance CLEAR CLEAR   Specific Gravity, Urine >1.030 (H) 1.005 - 1.030   pH 5.5 5.0 - 8.0   Glucose, UA NEGATIVE NEGATIVE mg/dL   Hgb urine dipstick NEGATIVE NEGATIVE   Bilirubin Urine NEGATIVE NEGATIVE   Ketones, ur >80 (A) NEGATIVE mg/dL   Protein, ur NEGATIVE NEGATIVE mg/dL   Nitrite NEGATIVE NEGATIVE   Leukocytes, UA NEGATIVE NEGATIVE    Review of Systems  Constitutional: Negative for fever and weight loss.  Gastrointestinal: Negative for abdominal pain.  Genitourinary: Negative for dysuria and urgency.  Neurological: Negative for dizziness and weakness.   Physical Exam   Blood pressure 109/62, pulse 88, temperature 98.2 F (36.8 C),  temperature source Oral, resp. rate 16, last menstrual period 02/10/2016, SpO2 100 %.  Physical Exam  Constitutional: She is oriented to person, place, and time. She appears well-developed and well-nourished. No distress.  Respiratory: Effort normal.  Musculoskeletal: Normal range of motion.  Neurological: She is alert and oriented to person, place, and time.  Skin: Skin is warm. She is not diaphoretic.  Psychiatric: Her behavior is normal.    MAU Course  Procedures  None  MDM  + fetal heart tones D5LR bolus X 1 MVI X 1 Zofran 4 mg ODT Reglan 10 mg IV Pepcid IV X1 Patient tolerating PO fluids in MAU.  Assessment and Plan   A:  1. Hyperemesis affecting pregnancy, antepartum     P:  Discharge home in stable condition Rx: Zofran       Phenergan Suppository  Return to MAU if symptoms  worsen Small, frequent meals    Duane LopeJennifer I Rasch, NP 05/06/2016 11:10 PM

## 2016-05-06 NOTE — Discharge Instructions (Signed)
Eating Plan for Hyperemesis Gravidarum Severe cases of hyperemesis gravidarum can lead to dehydration and malnutrition. The hyperemesis eating plan is one way to lessen the symptoms of nausea and vomiting. It is often used with prescribed medicines to control your symptoms.  WHAT CAN I DO TO RELIEVE MY SYMPTOMS? Listen to your body. Everyone is different and has different preferences. Find what works best for you. Some of the following things may help:  Eat and drink slowly.  Eat 5-6 small meals daily instead of 3 large meals.   Eat crackers before you get out of bed in the morning.   Starchy foods are usually well tolerated (such as cereal, toast, bread, potatoes, pasta, rice, and pretzels).   Ginger may help with nausea. Add  tsp ground ginger to hot tea or choose ginger tea.   Try drinking 100% fruit juice or an electrolyte drink.  Continue to take your prenatal vitamins as directed by your health care provider. If you are having trouble taking your prenatal vitamins, talk with your health care provider about different options.  Include at least 1 serving of protein with your meals and snacks (such as meats or poultry, beans, nuts, eggs, or yogurt). Try eating a protein-rich snack before bed (such as cheese and crackers or a half turkey or peanut butter sandwich). WHAT THINGS SHOULD I AVOID TO REDUCE MY SYMPTOMS? The following things may help reduce your symptoms:  Avoid foods with strong smells. Try eating meals in well-ventilated areas that are free of odors.  Avoid drinking water or other beverages with meals. Try not to drink anything less than 30 minutes before and after meals.  Avoid drinking more than 1 cup of fluid at a time.  Avoid fried or high-fat foods, such as butter and cream sauces.  Avoid spicy foods.  Avoid skipping meals the best you can. Nausea can be more intense on an empty stomach. If you cannot tolerate food at that time, do not force it. Try sucking on  ice chips or other frozen items and make up the calories later.  Avoid lying down within 2 hours after eating.   This information is not intended to replace advice given to you by your health care provider. Make sure you discuss any questions you have with your health care provider.   Document Released: 05/17/2007 Document Revised: 07/25/2013 Document Reviewed: 05/24/2013 Elsevier Interactive Patient Education 2016 Elsevier Inc[employer] Hyperemesis Gravidarum Hyperemesis gravidarum is a severe form of nausea and vomiting that happens during pregnancy. Hyperemesis is worse than morning sickness. It may cause you to have nausea or vomiting all day for many days. It may keep you from eating and drinking enough food and liquids. Hyperemesis usually occurs during the first half (the first 20 weeks) of pregnancy. It often goes away once a woman is in her second half of pregnancy. However, sometimes hyperemesis continues through an entire pregnancy.  CAUSES  The cause of this condition is not completely known but is thought to be related to changes in the body's hormones when pregnant. It could be from the high level of the pregnancy hormone or an increase in estrogen in the body.  SIGNS AND SYMPTOMS   Severe nausea and vomiting.  Nausea that does not go away.  Vomiting that does not allow you to keep any food down.  Weight loss and body fluid loss (dehydration).  Having no desire to eat or not liking food you have previously enjoyed. DIAGNOSIS  Your health care provider will   do a physical exam and ask you about your symptoms. He or she may also order blood tests and urine tests to make sure something else is not causing the problem.  TREATMENT  You may only need medicine to control the problem. If medicines do not control the nausea and vomiting, you will be treated in the hospital to prevent dehydration, increased acid in the blood (acidosis), weight loss, and changes in the electrolytes in your body  that may harm the unborn baby (fetus). You may need IV fluids.  HOME CARE INSTRUCTIONS   Only take over-the-counter or prescription medicines as directed by your health care provider.  Try eating a couple of dry crackers or toast in the morning before getting out of bed.  Avoid foods and smells that upset your stomach.  Avoid fatty and spicy foods.  Eat 5-6 small meals a day.  Do not drink when eating meals. Drink between meals.  For snacks, eat high-protein foods, such as cheese.  Eat or suck on things that have ginger in them. Ginger helps nausea.  Avoid food preparation. The smell of food can spoil your appetite.  Avoid iron pills and iron in your multivitamins until after 3-4 months of being pregnant. However, consult with your health care provider before stopping any prescribed iron pills. SEEK MEDICAL CARE IF:   Your abdominal pain increases.  You have a severe headache.  You have vision problems.  You are losing weight. SEEK IMMEDIATE MEDICAL CARE IF:   You are unable to keep fluids down.  You vomit blood.  You have constant nausea and vomiting.  You have excessive weakness.  You have extreme thirst.  You have dizziness or fainting.  You have a fever or persistent symptoms for more than 2-3 days.  You have a fever and your symptoms suddenly get worse. MAKE SURE YOU:   Understand these instructions.  Will watch your condition.  Will get help right away if you are not doing well or get worse.   This information is not intended to replace advice given to you by your health care provider. Make sure you discuss any questions you have with your health care provider.   Document Released: 07/20/2005 Document Revised: 05/10/2013 Document Reviewed: 03/01/2013 Elsevier Interactive Patient Education 2016 Elsevier Inc.  

## 2016-05-13 ENCOUNTER — Telehealth: Payer: Self-pay

## 2016-05-13 NOTE — Telephone Encounter (Signed)
Returned pt call, left vm to call back. 

## 2016-05-14 NOTE — Telephone Encounter (Signed)
Returned call to patient regarding RX refill.  Patient did not leave the name of medication she needed refilled.  Left message for her to call.  Her Phenergan suppository and Zofran still have active refills.

## 2016-05-21 ENCOUNTER — Ambulatory Visit (INDEPENDENT_AMBULATORY_CARE_PROVIDER_SITE_OTHER): Payer: 59 | Admitting: Obstetrics

## 2016-05-21 ENCOUNTER — Encounter: Payer: Self-pay | Admitting: Obstetrics

## 2016-05-21 VITALS — BP 115/73 | HR 100 | Temp 98.8°F | Wt 139.6 lb

## 2016-05-21 DIAGNOSIS — Z3492 Encounter for supervision of normal pregnancy, unspecified, second trimester: Secondary | ICD-10-CM

## 2016-05-21 DIAGNOSIS — O219 Vomiting of pregnancy, unspecified: Secondary | ICD-10-CM

## 2016-05-21 MED ORDER — ONDANSETRON 4 MG PO TBDP: 4.0000 mg | ORAL_TABLET | Freq: Three times a day (TID) | ORAL | 2 refills | Status: DC | PRN

## 2016-05-21 NOTE — Progress Notes (Signed)
  Subjective:    Melanie Serrano is a 25 y.o. female being seen today for her obstetrical visit. She is at 98102w3d gestation. Patient reports: no complaints.  Problem List Items Addressed This Visit    None    Visit Diagnoses    Prenatal care in second trimester    -  Primary   Nausea and vomiting during pregnancy prior to [redacted] weeks gestation       Relevant Medications   ondansetron (ZOFRAN ODT) 4 MG disintegrating tablet     Patient Active Problem List   Diagnosis Date Noted  . MDD (major depressive disorder) 12/29/2015  . 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 Labs reviewed.  Follow up in 4 weeks. FIRST/CF mutation testing/NIPT/QUAD SCREEN/fragile X/Ashkenazi Jewish population testing/Spinal muscular atrophy discussed: requested. Role of ultrasound in pregnancy discussed; fetal survey: requested. Amniocentesis discussed: not indicated. 50% of 15 minute visit spent on counseling and coordination of care.

## 2016-05-28 ENCOUNTER — Other Ambulatory Visit: Payer: Self-pay | Admitting: Obstetrics

## 2016-05-28 ENCOUNTER — Telehealth: Payer: Self-pay | Admitting: *Deleted

## 2016-05-28 NOTE — Telephone Encounter (Signed)
Patient receive 30 Zofran with 2 refills on 05-21-16.  She does not need more Zofran.

## 2016-05-28 NOTE — Telephone Encounter (Signed)
Patient would like a refill of her Zofran- she said Dr Clearance CootsHarper told her to call when needed.

## 2016-06-01 NOTE — Telephone Encounter (Signed)
LM on VM- call pharmacy

## 2016-06-04 ENCOUNTER — Ambulatory Visit (INDEPENDENT_AMBULATORY_CARE_PROVIDER_SITE_OTHER): Payer: 59 | Admitting: Obstetrics

## 2016-06-04 ENCOUNTER — Encounter: Payer: Self-pay | Admitting: Obstetrics

## 2016-06-04 VITALS — BP 119/74 | HR 98 | Temp 97.4°F | Wt 138.4 lb

## 2016-06-04 DIAGNOSIS — Z3482 Encounter for supervision of other normal pregnancy, second trimester: Secondary | ICD-10-CM

## 2016-06-04 DIAGNOSIS — O0990 Supervision of high risk pregnancy, unspecified, unspecified trimester: Secondary | ICD-10-CM

## 2016-06-04 DIAGNOSIS — Z3480 Encounter for supervision of other normal pregnancy, unspecified trimester: Secondary | ICD-10-CM

## 2016-06-04 DIAGNOSIS — Z124 Encounter for screening for malignant neoplasm of cervix: Secondary | ICD-10-CM | POA: Diagnosis not present

## 2016-06-04 NOTE — Progress Notes (Signed)
Patient c/o constipation previously

## 2016-06-04 NOTE — Progress Notes (Signed)
  Subjective:    Melanie Serrano is a 25 y.o. female being seen today for her obstetrical visit. She is at 9490w3d gestation. Patient reports: no complaints.  Problem List Items Addressed This Visit    Supervision of high-risk pregnancy    Other Visit Diagnoses    Supervision of other normal pregnancy, antepartum    -  Primary   Relevant Orders   Prenatal Profile I   HIV antibody   Hemoglobinopathy evaluation   Varicella zoster antibody, IgG   VITAMIN D 25 Hydroxy (Vit-D Deficiency, Fractures)   Culture, OB Urine   ToxASSURE Select 13 (MW), Urine   NuSwab VG+, Candida 6sp   Cytology - PAP   AFP, Quad Screen   US OB Comp + 14 Wk     Patient Active Problem List   Diagnosis Date Noted  . Supervision of high-risk pregnancy 06/04/2016  . MDD (major depressive disorder) 12/29/2015  . MDD (major depressive disorder), recurrent severe, without psychosis (HCC) 12/29/2015    Objective:     BP 119/74   Pulse 98   Temp 97.4 F (36.3 C)   Wt 138 lb 6.4 oz (62.8 kg)   LMP 02/10/2016   BMI 23.94 kg/m  Uterine Size: Below umbilicus     Assessment:    Pregnancy @ 5090w3d  weeks Doing well    Plan:    Problem list reviewed and updated. Labs reviewed.  Follow up in 4 weeks. FIRST/CF mutation testing/NIPT/QUAD SCREEN/fragile X/Ashkenazi Jewish population testing/Spinal muscular atrophy discussed: requested. Role of ultrasound in pregnancy discussed; fetal survey: requested. Amniocentesis discussed: not indicated. 50% of 15 minute visit spent on counseling and coordination of care.

## 2016-06-07 LAB — CULTURE, OB URINE

## 2016-06-07 LAB — URINE CULTURE, OB REFLEX

## 2016-06-08 LAB — NUSWAB VG+, CANDIDA 6SP
Candida albicans, NAA: NEGATIVE
Candida glabrata, NAA: NEGATIVE
Candida krusei, NAA: NEGATIVE
Candida lusitaniae, NAA: NEGATIVE
Candida parapsilosis, NAA: NEGATIVE
Candida tropicalis, NAA: NEGATIVE
Chlamydia trachomatis, NAA: NEGATIVE
Neisseria gonorrhoeae, NAA: NEGATIVE
Trich vag by NAA: NEGATIVE

## 2016-06-08 LAB — CYTOLOGY - PAP: Diagnosis: NEGATIVE

## 2016-06-12 LAB — PRENATAL PROFILE I(LABCORP)
Antibody Screen: NEGATIVE
Basophils Absolute: 0 10*3/uL (ref 0.0–0.2)
Basos: 0 %
EOS (ABSOLUTE): 0 10*3/uL (ref 0.0–0.4)
Eos: 0 %
Hematocrit: 39.2 % (ref 34.0–46.6)
Hemoglobin: 13.8 g/dL (ref 11.1–15.9)
Hepatitis B Surface Ag: NEGATIVE
Immature Grans (Abs): 0 10*3/uL (ref 0.0–0.1)
Immature Granulocytes: 0 %
Lymphocytes Absolute: 1.4 10*3/uL (ref 0.7–3.1)
Lymphs: 18 %
MCH: 31.1 pg (ref 26.6–33.0)
MCHC: 35.2 g/dL (ref 31.5–35.7)
MCV: 88 fL (ref 79–97)
Monocytes Absolute: 0.4 10*3/uL (ref 0.1–0.9)
Monocytes: 5 %
Neutrophils Absolute: 6 10*3/uL (ref 1.4–7.0)
Neutrophils: 77 %
Platelets: 209 10*3/uL (ref 150–379)
RBC: 4.44 x10E6/uL (ref 3.77–5.28)
RDW: 13.6 % (ref 12.3–15.4)
RPR Ser Ql: NONREACTIVE
Rh Factor: POSITIVE
Rubella Antibodies, IGG: 1.08 index (ref >0.99)
WBC: 7.8 10*3/uL (ref 3.4–10.8)

## 2016-06-12 LAB — AFP, QUAD SCREEN
DIA Mom Value: 1.36
DIA Value (EIA): 255.07 pg/mL
DSR (By Age)    1 IN: 987
DSR (Second Trimester) 1 IN: 5143
Gestational Age: 16.4 WEEKS
MSAFP Mom: 1.19
MSAFP: 43.5 ng/mL
MSHCG Mom: 0.62
MSHCG: 23479 m[IU]/mL
Maternal Age At EDD: 25.9 YEARS
Osb Risk: 6704
T18 (By Age): 1:3847 {titer}
Test Results:: NEGATIVE
Weight: 138 [lb_av]
uE3 Mom: 0.72
uE3 Value: 0.67 ng/mL

## 2016-06-12 LAB — HIV ANTIBODY (ROUTINE TESTING W REFLEX): HIV Screen 4th Generation wRfx: NONREACTIVE

## 2016-06-12 LAB — VARICELLA ZOSTER ANTIBODY, IGG: Varicella zoster IgG: 186 index (ref >165)

## 2016-06-12 LAB — HEMOGLOBINOPATHY EVALUATION
HGB C: 0 %
HGB S: 0 %
Hemoglobin A2 Quantitation: 2.6 % (ref 0.7–3.1)
Hemoglobin F Quantitation: 0.6 % (ref 0.0–2.0)
Hgb A: 96.8 % (ref 94.0–98.0)

## 2016-06-12 LAB — TOXASSURE SELECT 13 (MW), URINE

## 2016-06-12 LAB — VITAMIN D 25 HYDROXY (VIT D DEFICIENCY, FRACTURES): Vit D, 25-Hydroxy: 48.2 ng/mL (ref 30.0–100.0)

## 2016-06-18 ENCOUNTER — Encounter (HOSPITAL_COMMUNITY): Payer: Self-pay

## 2016-06-18 ENCOUNTER — Ambulatory Visit: Payer: 59

## 2016-06-18 ENCOUNTER — Inpatient Hospital Stay (HOSPITAL_COMMUNITY)
Admission: AD | Admit: 2016-06-18 | Discharge: 2016-06-18 | Disposition: A | Discharge status: Home / Self Care | Payer: 59 | Source: Ambulatory Visit | Attending: Obstetrics & Gynecology | Admitting: Obstetrics & Gynecology

## 2016-06-18 DIAGNOSIS — Z3A18 18 weeks gestation of pregnancy: Secondary | ICD-10-CM | POA: Insufficient documentation

## 2016-06-18 DIAGNOSIS — O26893 Other specified pregnancy related conditions, third trimester: Secondary | ICD-10-CM | POA: Insufficient documentation

## 2016-06-18 DIAGNOSIS — O26892 Other specified pregnancy related conditions, second trimester: Secondary | ICD-10-CM | POA: Diagnosis present

## 2016-06-18 DIAGNOSIS — Z3480 Encounter for supervision of other normal pregnancy, unspecified trimester: Secondary | ICD-10-CM

## 2016-06-18 DIAGNOSIS — Z363 Encounter for antenatal screening for malformations: Secondary | ICD-10-CM

## 2016-06-18 DIAGNOSIS — O4102X1 Oligohydramnios, second trimester, fetus 1: Secondary | ICD-10-CM

## 2016-06-18 DIAGNOSIS — Z3A20 20 weeks gestation of pregnancy: Secondary | ICD-10-CM

## 2016-06-18 DIAGNOSIS — O0990 Supervision of high risk pregnancy, unspecified, unspecified trimester: Secondary | ICD-10-CM

## 2016-06-18 HISTORY — DX: Anxiety disorder, unspecified: F41.9

## 2016-06-18 LAB — URINALYSIS, ROUTINE W REFLEX MICROSCOPIC
Bilirubin Urine: NEGATIVE
Glucose, UA: NEGATIVE mg/dL
Hgb urine dipstick: NEGATIVE
Ketones, ur: NEGATIVE mg/dL
Leukocytes, UA: NEGATIVE
Nitrite: NEGATIVE
Protein, ur: NEGATIVE mg/dL
Specific Gravity, Urine: 1.01 (ref 1.005–1.030)
pH: 7 (ref 5.0–8.0)

## 2016-06-18 LAB — WET PREP, GENITAL
Clue Cells Wet Prep HPF POC: NONE SEEN
Sperm: NONE SEEN
Trich, Wet Prep: NONE SEEN
Yeast Wet Prep HPF POC: NONE SEEN

## 2016-06-18 LAB — AMNISURE RUPTURE OF MEMBRANE (ROM) NOT AT ARMC: Amnisure ROM: NEGATIVE

## 2016-06-18 LAB — POCT FERN TEST: POCT Fern Test: NEGATIVE

## 2016-06-18 NOTE — MAU Note (Signed)
Pt was at her office visit for anatomy scan. AFI was low and pt remembered when she woke up this morning her panties were wet. Sent over for further eval

## 2016-06-18 NOTE — Discharge Instructions (Signed)
Oligohydramnios Oligohydramnios is a conditionin which there is not enough fluid in the sac (amniotic sac) that surrounds your unborn baby (fetus). The amniotic sac in the uterus contains fluid (amniotic fluid) that:  Protects your baby from injury (trauma) and infections.  Helps your baby move freely inside the uterus.  Helps your baby's lungs, kidneys, and digestive system to develop. This condition occurs before birth (is a prenatal condition), and it can interfere with your baby's normal prenatal growth and development. Oligohydramnios can happen any time during pregnancy, and it most commonly occurs during the last 3 months (third trimester). It is most likely to cause serious complications when it occurs early in pregnancy. Some possible complications of this condition include:  Early (premature) birth.  Birth defects.  Limited (restricted) fetal growth.  Decreased oxygen flow to the fetus due to pressure on the umbilical cord.  Pregnancy loss (miscarriage).  Stillbirth. What are the causes? This condition may be caused by:  A leak or tear in the amniotic sac.  A problem with the organ that nourishes the baby in the uterus (placenta), such as failure of the placenta to provide enough blood, fluid, and nutrients to the baby.  Having identical twins who share the same placenta.  A fetal birth defect. This is usually an abnormality in the fetal kidneys or urinary tract.  A pregnancy that goes past the due date.  A condition that the mother has, such as:  A lack of fluids in the body (dehydration).  High blood pressure.  Diabetes.  Reactions to certain medicines, such as ibuprofen or blood pressure medicines (ACE inhibitors).  Systemic lupus. In some cases, the cause is unknown. What increases the risk? This condition is more likely to develop if you:  Become dehydrated.  Have high blood pressure.  Take NSAIDs or ACE inhibitors.  Have diabetes or  lupus.  Have poor prenatal care. What are the signs or symptoms? In most cases, there are no symptoms of oligohydramnios. If you do have symptoms, they may include:  Fluid leaking from the vagina.  Having a uterus that is smaller than normal.  Feeling less movement of your baby in your uterus. How is this diagnosed? This condition may be diagnosed by measuring the amount of amniotic fluid in your amniotic sac using the amniotic fluid index (AFI). The AFI is a prenatal ultrasound test that uses painless, harmless sound waves to create an image of your uterus and your baby. An AFI prenatal ultrasound test may be done:  To measure the amniotic fluid level.  To check your baby's kidneys and your baby's growth.  To evaluate the placenta. You may also have other tests to find the cause of oligohydramnios. How is this treated? Treatment for this condition depends on how low your amniotic fluid level is, how far along you are in your pregnancy, and your overall health. Treatment may include:  Having your health care provider monitor your condition more closely than usual. You may have more frequent appointments and more AFI ultrasound measurements.  Increasing the amount of fluid in your body. This may be done by having you drink more fluids, or by giving you fluids through an IV tube that is inserted into one of your veins.  Injecting fluid into your amniotic sac during delivery (amnioinfusion).  Having your baby delivered early, if you are close to your due date. Follow these instructions at home: Lifestyle  Do not drink alcohol. No safe level of alcohol consumption during pregnancy has  been determined.  Do not use any tobacco products, such as cigarettes, chewing tobacco, and e-cigarettes. If you need help quitting, ask your health care provider.  Do not use any illegal drugs. These can harm your developing baby or cause a miscarriage. General instructions  Take over-the-counter  and prescription medicines only as told by your health care provider.  Follow instructions from your health care provider about physical activity and rest. Your health care provider may recommend that you stay in bed (be on bed rest).  Follow instructions from your health care provider about eating or drinking restrictions.  Eat healthy foods.  Drink enough fluids to keep your urine clear or pale yellow.  Keep all prenatal care appointments with your health care provider. This is important. Contact a health care provider if:  You notice that your baby seems to be moving less than usual. Get help right away if:  You have fluid leaking from your vagina.  You start to have labor pains (contractions). This may feel like a sense of tightening in your lower abdomen.  You have a fever. This information is not intended to replace advice given to you by your health care provider. Make sure you discuss any questions you have with your health care provider. Document Released: 11/04/2010 Document Revised: 12/23/2015 Document Reviewed: 01/30/2015 Elsevier Interactive Patient Education  2017 [employer]

## 2016-06-18 NOTE — MAU Provider Note (Signed)
Chief Complaint  Patient presents with  . Rupture of Membranes     First Provider Initiated Contact with Patient 06/18/16 1321      S: Melanie Serrano  is a 25 y.o. y.o. year old [id number] female at 689w3d weeks gestation who was sent to MAU from [facility] for R/O ROM. Amniotic fluid was low on routine anatomy scan today. Pt reports sleeping w/ a pillow btw her legs at night and notice that the pillow felt damp at 0400. No obvious leaking of fluid.   Contractions: None Vaginal bleeding: None Fetal movement: Nml  O: Patient Vitals for the past 24 hrs:  BP Temp Pulse Resp Height Weight  06/18/16 1453 118/66 - 86 18 - -  06/18/16 1204 126/62 98.3 F (36.8 C) 98 18 5\' 3"  (1.6 m) 141 lb 6.4 oz (64.1 kg)   General: NAD Heart: Regular rate Lungs: Normal rate and effort Abd: Soft, NT, Gravid, S=D Pelvic: NEFG, Neg pooling, no blood. Cervix visually long and closed.   FHR: 155 by doppler  Neg Fern Neg Amnisure  Discussed Hx, exam, US w/ Dr. Erin FullingHarraway-Luann Aspinwall. Needs MFM US OP in 2 weeks to re-eval fluid and anatomy.  A: 769w3d week IUP  No evidence of PPPROM  P: Discharge home in stable condition per consult w/ Willodean Rosenthalarolyn Harraway-Mckenze Slone. Preterm labor and PPPROM precautions. Follow-up Information    [facility] up on 07/[facility]/2016.   Why:  as scheduled for prenatal visit Contact information: [address] [place] [phone]       [facility] Follow up.   Why:  as needed in emergencies Contact information: [address] [id number] mc [place] [phone]       [facility] Follow up.   Specialty:  Maternal and Fetal Medicine Why:  will call you to schedule follow-up ultrasound Contact information: [address] [id number] [place] [id number] [phone]          Dorathy KinsmanVirginia Chaquetta Schlottman,  [place] 06/18/2016 2:59 PM  2

## 2016-06-24 ENCOUNTER — Other Ambulatory Visit: Payer: Self-pay | Admitting: Advanced Practice Midwife

## 2016-06-24 DIAGNOSIS — O4100X0 Oligohydramnios, unspecified trimester, not applicable or unspecified: Secondary | ICD-10-CM

## 2016-06-24 DIAGNOSIS — O4102X0 Oligohydramnios, second trimester, not applicable or unspecified: Secondary | ICD-10-CM

## 2016-07-02 ENCOUNTER — Ambulatory Visit (INDEPENDENT_AMBULATORY_CARE_PROVIDER_SITE_OTHER): Payer: 59 | Admitting: Obstetrics

## 2016-07-02 ENCOUNTER — Encounter: Payer: Self-pay | Admitting: Obstetrics

## 2016-07-02 VITALS — BP 133/78 | HR 104 | Temp 97.4°F | Wt 141.3 lb

## 2016-07-02 DIAGNOSIS — J0110 Acute frontal sinusitis, unspecified: Secondary | ICD-10-CM

## 2016-07-02 DIAGNOSIS — O219 Vomiting of pregnancy, unspecified: Secondary | ICD-10-CM

## 2016-07-02 MED ORDER — AZITHROMYCIN 250 MG PO TABS
ORAL_TABLET | ORAL | 0 refills | Status: DC
Start: 2016-07-02 — End: 2016-08-01

## 2016-07-02 MED ORDER — DOXYLAMINE-PYRIDOXINE 10-10 MG PO TBEC: DELAYED_RELEASE_TABLET | ORAL | 5 refills | Status: DC

## 2016-07-02 NOTE — Progress Notes (Signed)
Subjective:    Melanie Serrano is a 25 y.o. female being seen today for her obstetrical visit. She is at 9225w3d gestation. Patient reports: nausea and vomiting . Fetal movement: normal.  Problem List Items Addressed This Visit    None    Visit Diagnoses    Nausea and vomiting in pregnancy prior to [redacted] weeks gestation    -  Primary   Relevant Medications   Doxylamine-Pyridoxine (DICLEGIS) 10-10 MG TBEC   Acute frontal sinusitis, recurrence not specified       Relevant Medications   azithromycin (ZITHROMAX) 250 MG tablet     Patient Active Problem List   Diagnosis Date Noted  . Supervision of high-risk pregnancy 06/04/2016  . MDD (major depressive disorder) 12/29/2015  . MDD (major depressive disorder), recurrent severe, without psychosis (HCC) 12/29/2015   Objective:    BP 133/78   Pulse (!) 104   Temp 97.4 F (36.3 C)   Wt 141 lb 4.8 oz (64.1 kg)   LMP 02/10/2016   BMI 25.03 kg/m  FHT: 150 BPM  Uterine Size: size equals dates     Assessment:    Pregnancy @ 3825w3d    Plan:    Signs and symptoms of preterm labor: discussed.  Labs, problem list reviewed and updated 2 hr GTT planned Follow up in 4 weeks.

## 2016-07-02 NOTE — Progress Notes (Signed)
Patient c/o sickness and unable to keep food down

## 2016-07-08 ENCOUNTER — Ambulatory Visit (HOSPITAL_COMMUNITY)
Admission: RE | Admit: 2016-07-08 | Discharge: 2016-07-08 | Disposition: A | Discharge status: Home / Self Care | Payer: 59 | Source: Ambulatory Visit | Attending: Obstetrics | Admitting: Obstetrics

## 2016-07-08 ENCOUNTER — Other Ambulatory Visit: Payer: Self-pay | Admitting: Advanced Practice Midwife

## 2016-07-08 ENCOUNTER — Encounter (HOSPITAL_COMMUNITY): Payer: Self-pay

## 2016-07-08 DIAGNOSIS — Z3A21 21 weeks gestation of pregnancy: Secondary | ICD-10-CM | POA: Insufficient documentation

## 2016-07-08 DIAGNOSIS — O4102X0 Oligohydramnios, second trimester, not applicable or unspecified: Secondary | ICD-10-CM | POA: Diagnosis not present

## 2016-07-08 DIAGNOSIS — Z363 Encounter for antenatal screening for malformations: Secondary | ICD-10-CM | POA: Insufficient documentation

## 2016-07-08 DIAGNOSIS — Z369 Encounter for antenatal screening, unspecified: Secondary | ICD-10-CM

## 2016-07-09 ENCOUNTER — Other Ambulatory Visit: Payer: Self-pay | Admitting: Obstetrics

## 2016-07-09 DIAGNOSIS — K029 Dental caries, unspecified: Secondary | ICD-10-CM

## 2016-07-10 ENCOUNTER — Encounter: Payer: Self-pay | Admitting: Obstetrics

## 2016-07-23 ENCOUNTER — Encounter: Payer: Self-pay | Admitting: Obstetrics

## 2016-07-23 ENCOUNTER — Ambulatory Visit (INDEPENDENT_AMBULATORY_CARE_PROVIDER_SITE_OTHER): Payer: 59 | Admitting: Obstetrics

## 2016-07-23 VITALS — BP 125/79 | HR 91 | Wt 145.0 lb

## 2016-07-23 DIAGNOSIS — O99340 Other mental disorders complicating pregnancy, unspecified trimester: Secondary | ICD-10-CM

## 2016-07-23 DIAGNOSIS — F329 Major depressive disorder, single episode, unspecified: Secondary | ICD-10-CM

## 2016-07-23 DIAGNOSIS — Z3480 Encounter for supervision of other normal pregnancy, unspecified trimester: Secondary | ICD-10-CM

## 2016-07-23 DIAGNOSIS — O0992 Supervision of high risk pregnancy, unspecified, second trimester: Secondary | ICD-10-CM

## 2016-07-23 NOTE — Progress Notes (Signed)
Subjective:    Melanie Serrano is a 25 y.o. female being seen today for her obstetrical visit. She is at 6580w3d gestation. Patient reports: no complaints . Fetal movement: normal.  Problem List Items Addressed This Visit    Supervision of high-risk pregnancy    Other Visit Diagnoses    Depression affecting pregnancy, antepartum    -  Primary   Relevant Orders   Ambulatory referral to Integrated Behavioral Health     Patient Active Problem List   Diagnosis Date Noted  . Supervision of high-risk pregnancy 06/04/2016  . MDD (major depressive disorder) 12/29/2015  . MDD (major depressive disorder), recurrent severe, without psychosis (HCC) 12/29/2015   Objective:    BP 125/79   Pulse 91   Wt 145 lb (65.8 kg)   LMP 02/10/2016   BMI 25.69 kg/m  FHT: 150 BPM  Uterine Size: size equals dates     Assessment:    Pregnancy @ 980w3d    Plan:    Signs and symptoms of preterm labor: discussed.  Labs, problem list reviewed and updated 2 hr GTT planned Follow up in 4 weeks.  Patient ID: Melanie KaBrandy Gaza, female   DOB: Dec 21, 1990, 25 y.o.   MRN: [id number]

## 2016-07-24 ENCOUNTER — Ambulatory Visit (INDEPENDENT_AMBULATORY_CARE_PROVIDER_SITE_OTHER): Payer: 59 | Admitting: Clinical

## 2016-07-24 DIAGNOSIS — F332 Major depressive disorder, recurrent severe without psychotic features: Secondary | ICD-10-CM | POA: Diagnosis not present

## 2016-07-24 NOTE — BH Specialist Note (Signed)
Session Start time: 10:35   End Time: 11:35 Total Time:  60 minutes Type of Service: Behavioral Health - Individual/Family Interpreter: No.   Interpreter Name & Language: n/a # [facility]  SUBJECTIVE: Melanie Serrano is a 25 y.o. female  Pt. was referred by Dr. Clearance CootsHarper for:  depression. Pt. reports the following symptoms/concerns: Pt states that she does not want to take any BH meds, because she worries about pregnancy risk, after last miscarriage; experienced postpartum depression after birth of 25yo.She would like BH meds after baby is born; open to practical steps she can take to reduce symptoms; many psychosocial issues. Duration of problem:  Increase in past four months Severity: severe Previous treatment: Yes, inpatient after SI/attempt in May 2017  OBJECTIVE: Mood: Depressed & Affect: Tearful Risk of harm to self or others: Low risk of harm to self today, denies SI or plan today, pt looking forward; no known risk of harm to others Assessments administered: PHQ9: 21/ GAD7: 17 *Pt states w question 9: "I don't want to hurt myself. At all. I want to be healthy for my baby." She says her 'thoughts of death' have to do with feeling 'deathly sick' from the hyperemesis gravidarum.  LIFE CONTEXT:  Family & Social: Lives with sister, sister's children, her 6yo, and other family members  School/ Work: Only resources coming in are EBT and WIC; actively job-searching Self-Care: Difficulty eating and sleeping, some marijuana for appetite early in pregnancy Life changes: Current pregnancy; husband in substance treatment, move into sister's home What is important to pt/family (values): Staying emotionally and physically well for family  GOALS ADDRESSED:  -Reduce symptoms of anxiety and depression  INTERVENTIONS: Solution Focused and Strength-based   ASSESSMENT:  Pt currently experiencing Major depressive disorder, severe, no psychotic features. Pt may benefit from  psychoeducation and brief therapeutic intervention regarding coping with symptoms of anxiety and depression.   PLAN: 1. F/U with behavioral health clinician: Next visit, about 4 weeks (earlier if needed) 2. Behavioral Health meds: none 3. Behavioral recommendations:  -Take at least 20 minute nap at home, after office visit today (consider using sound app, to mimimize household noises) -Consider "Worry Hour" strategy to prioritize life stressors -Consider community resources discussed in office visit today([facility], [facility]/ [facility]/ [place] and [employer]/ transportation options) -Share substance community resources with husband 4. Referral: Brief Counseling/Psychotherapy, Publishing rights managerCommunity Resource and Psychoeducation 5. From scale of 1-10, how likely are you to follow plan: 8  Melanie LipsJamie C Sora Serrano LCSWA Behavioral Health Clinician  Marlon PelWarmhandoff: no   Depression screen [facility] 07/24/2016  Decreased Interest 2  Down, Depressed, Hopeless 3  PHQ - 2 Score 5  Altered sleeping 3  Tired, decreased energy 2  Change in appetite 3  Feeling bad or failure about yourself  3  Trouble concentrating 1  Moving slowly or fidgety/restless 2  Suicidal thoughts 2  PHQ-9 Score 21   GAD 7 : Generalized Anxiety Score 07/24/2016  Nervous, Anxious, on Edge 2  Control/stop worrying 3  Worry too much - different things 3  Trouble relaxing 2  Restless 1  Easily annoyed or irritable 3  Afraid - awful might happen 3  Total GAD 7 Score 17

## 2016-08-01 ENCOUNTER — Inpatient Hospital Stay (HOSPITAL_COMMUNITY)
Admission: AD | Admit: 2016-08-01 | Discharge: 2016-08-01 | Disposition: A | Discharge status: Home / Self Care | Payer: 59 | Source: Ambulatory Visit | Attending: Obstetrics and Gynecology | Admitting: Obstetrics and Gynecology

## 2016-08-01 ENCOUNTER — Encounter (HOSPITAL_COMMUNITY): Payer: Self-pay | Admitting: *Deleted

## 2016-08-01 DIAGNOSIS — N898 Other specified noninflammatory disorders of vagina: Secondary | ICD-10-CM | POA: Insufficient documentation

## 2016-08-01 DIAGNOSIS — Z3A24 24 weeks gestation of pregnancy: Secondary | ICD-10-CM | POA: Diagnosis not present

## 2016-08-01 DIAGNOSIS — F17210 Nicotine dependence, cigarettes, uncomplicated: Secondary | ICD-10-CM | POA: Diagnosis not present

## 2016-08-01 DIAGNOSIS — Z91013 Allergy to seafood: Secondary | ICD-10-CM | POA: Diagnosis not present

## 2016-08-01 DIAGNOSIS — O26892 Other specified pregnancy related conditions, second trimester: Secondary | ICD-10-CM | POA: Diagnosis not present

## 2016-08-01 DIAGNOSIS — O0992 Supervision of high risk pregnancy, unspecified, second trimester: Secondary | ICD-10-CM | POA: Diagnosis not present

## 2016-08-01 DIAGNOSIS — Z825 Family history of asthma and other chronic lower respiratory diseases: Secondary | ICD-10-CM | POA: Insufficient documentation

## 2016-08-01 DIAGNOSIS — Z8249 Family history of ischemic heart disease and other diseases of the circulatory system: Secondary | ICD-10-CM | POA: Diagnosis not present

## 2016-08-01 DIAGNOSIS — Z808 Family history of malignant neoplasm of other organs or systems: Secondary | ICD-10-CM | POA: Diagnosis not present

## 2016-08-01 DIAGNOSIS — O99332 Smoking (tobacco) complicating pregnancy, second trimester: Secondary | ICD-10-CM | POA: Diagnosis not present

## 2016-08-01 LAB — URINALYSIS, ROUTINE W REFLEX MICROSCOPIC
Bilirubin Urine: NEGATIVE
Glucose, UA: NEGATIVE mg/dL
Hgb urine dipstick: NEGATIVE
Ketones, ur: NEGATIVE mg/dL
LEUKOCYTES UA: NEGATIVE
Nitrite: NEGATIVE
PROTEIN: NEGATIVE mg/dL
SPECIFIC GRAVITY, URINE: 1.001 — AB (ref 1.005–1.030)
pH: 6 (ref 5.0–8.0)

## 2016-08-01 LAB — WET PREP, GENITAL
CLUE CELLS WET PREP: NONE SEEN
Sperm: NONE SEEN
TRICH WET PREP: NONE SEEN
Yeast Wet Prep HPF POC: NONE SEEN

## 2016-08-01 LAB — AMNISURE RUPTURE OF MEMBRANE (ROM) NOT AT ARMC: AMNISURE: NEGATIVE

## 2016-08-01 NOTE — Discharge Instructions (Signed)
Premature Rupture and Preterm Premature Rupture of Membranes °A sac made up of membranes surrounds your baby in the womb (uterus). Rupture of membranes is when this sac breaks open. This is also known as your "water breaking." When this sac breaks before labor starts, it is called premature rupture of membranes (PROM). If this happens before 37 weeks of being pregnant, it is called preterm premature rupture of membranes (PPROM). PPROM is serious. It needs medical care right away. °What increases the risk of PPROM? °PPROM is more likely to happen in women who: °· Have an infection. °· Have had PPROM before. °· Have a cervix that is short. °· Have bleeding during the second or third trimester. °· Have a low BMI. This is a measure of body fat. °· Smoke. °· Use drugs. °· Have a low socioeconomic status. ° °What problems can be caused by PROM and PPROM? °This condition creates health dangers for the mother and the baby. These include: °· Giving birth to the baby too early (prematurely). °· Getting a serious infection of the placenta (chorioamnionitis). °· Having the placenta detach from the uterus early (placental abruption). °· Squeezing of the umbilical cord. °· Getting a serious infection after delivery. ° °What are the signs of PROM and PPROM? °· A sudden gush of fluid from the vagina. °· A slow leak of fluid from the vagina. °· Your underwear is wet. °What should I do if I think my water broke? °Call your doctor right away. You will need to go to the hospital to get checked right away. °What happens if I am told that I have PROM or PPROM? °You will have tests done at the hospital. °· If you have PROM, you may be given medicine to start labor (be induced). This may be done if you are not having contractions during the 24 hours after your water broke. °· If you have PPROM and are not having contractions, you may be given medicine to start labor. It will depend on how far along you are in your pregnancy. ° °If you have  PPROM: °· You and your baby will be watched closely to see if you have infections or other problems. °· You may be given: °? An antibiotic medicine. This can stop an infection from starting. °? A steroid medicine. This can help your baby's lungs develop faster. °? A medicine to help prevent cerebral palsy in your baby. °? A medicine to stop early labor (preterm labor). °· You may be told to stay in bed except to use the bathroom (bed rest). °· You may be given medicine to start labor. This may be done if there are problems with you or the baby. ° °Your treatment will depend on many factors. °Contact a doctor if: °· Your water breaks and you are not having contractions. °Get help right away if: °· Your water breaks before you are [redacted] weeks pregnant. °Summary °· When your water breaks before labor starts, it is called premature rupture of membranes (PROM). °· When PROM happens before 37 weeks of pregnancy, it is called preterm premature rupture of membranes (PPROM). °· If you are not having contractions, your labor may be started for you. °This information is not intended to replace advice given to you by your health care provider. Make sure you discuss any questions you have with your health care provider. °Document Released: 10/16/2008 Document Revised: 04/09/2016 Document Reviewed: 04/09/2016 °Elsevier Interactive Patient Education © 2017 [employer] Inc. ° °

## 2016-08-01 NOTE — MAU Note (Signed)
Pt stated she was out to dinner and when she got up her panties where wet. Denies any avd cramping . Good fetal movement reported.

## 2016-08-01 NOTE — MAU Provider Note (Signed)
Chief Complaint:  Rupture of Membranes   HPI: Melanie Serrano is a 25 y.o. G3P1011 at [redacted]w[redacted]d who presents to maternity admissions reporting leaking fluid.  Patient was at dinner tonight, when she got up from her chair, she noticed a small wet spot on the chair and felt like her underwear was soaked. She went to the bathroom, noticed her underwear was wet, and smelled it (did not smell like urine per patient). She has been going to the bathroom frequently, and even urinated fully after the incident. She has not noticed and further gushes since coming in.   Denies contractions or vaginal bleeding. Good fetal movement.   Past Medical History: Past Medical History:  Diagnosis Date  . Anxiety   . Depression     Past obstetric history: OB History  Gravida Para Term Preterm AB Living  3 1 1   1 1   SAB TAB Ectopic Multiple Live Births  1       1    # Outcome Date GA Lbr Len/2nd Weight Sex Delivery Anes PTL Lv  3 Current           2 SAB 07/04/15          1 Term 02/02/10    M Vag-Spont   LIV      Past Surgical History: Past Surgical History:  Procedure Laterality Date  . WISDOM TOOTH EXTRACTION       Family History: Family History  Problem Relation Age of Onset  . Cancer Father   . Hypertension Father   . Melanoma Father   . COPD Mother   . Hypertension Mother   . Heart attack Maternal Grandfather   . Cancer Paternal Grandmother   . Diabetes Paternal Grandfather     Social History: Social History  Substance Use Topics  . Smoking status: Current Every Day Smoker    Packs/day: 0.50    Types: Cigarettes  . Smokeless tobacco: Current User     Comment: 1/2 pack/day  . Alcohol use No    Allergies:  Allergies  Allergen Reactions  . Shellfish Allergy Itching and Nausea And Vomiting    Meds:  Prescriptions Prior to Admission  Medication Sig Dispense Refill Last Dose  . acetaminophen (TYLENOL) 500 MG tablet Take 1,000 mg by mouth every 6 (six) hours as needed for  moderate pain.   Taking  . azithromycin (ZITHROMAX) 250 MG tablet Take 2 tabs initially, then 1 tab po daily. (Patient not taking: Reported on 07/23/2016) 15 each 0 Not Taking  . diphenhydramine-acetaminophen (TYLENOL PM) 25-500 MG TABS tablet Take 2 tablets by mouth at bedtime as needed (headache/sleep).    Taking  . Doxylamine-Pyridoxine (DICLEGIS) 10-10 MG TBEC 1 tab in AM, 1 tab mid afternoon 2 tabs at bedtime. Max dose 4 tabs daily. 100 tablet 5 Taking  . ondansetron (ZOFRAN ODT) 4 MG disintegrating tablet Take 1 tablet (4 mg total) by mouth every 8 (eight) hours as needed for nausea, vomiting or refractory nausea / vomiting. 30 tablet 2 Taking  . promethazine (PHENERGAN) 25 MG suppository Place 1 suppository (25 mg total) rectally every 6 (six) hours as needed for nausea or vomiting. (Patient not taking: Reported on 07/23/2016) 12 each 1 Not Taking    I have reviewed patient's Past Medical Hx, Surgical Hx, Family Hx, Social Hx, medications and allergies.   ROS:  A comprehensive ROS was negative except per HPI.    Physical Exam  Patient Vitals for the past 24 hrs:  BP Temp  Temp src Pulse Resp  08/01/16 2203 122/67 98.2 F (36.8 C) Oral 104 18   Constitutional: Well-developed, well-nourished female in no acute distress.  Cardiovascular: normal rate Respiratory: normal effort GI: Abd soft, non-tender, gravid appropriate for gestational age. Pos BS x 4 MS: Extremities nontender, no edema, normal ROM Neurologic: Alert and oriented x 4.  GU: Neg CVAT. Pelvic: NEFG with 6 small warts on right inner thigh and 2 small warts on left inner thigh, physiologic discharge, no blood, cervix clean with large physiologic mucous at internal os. No CMT. NO POOLING. No fluid from cervical os. SVE: closed, thick, high  FHT:  Baseline 135 , moderate variability, accelerations (10x10) present, no decelerations Contractions: None   Labs: Results for orders placed or performed during the hospital  encounter of 08/01/16 (from the past 24 hour(s))  Urinalysis, Routine w reflex microscopic     Status: Abnormal   Collection Time: 08/01/16  9:50 PM  Result Value Ref Range   Color, Urine COLORLESS (A) YELLOW   APPearance CLEAR CLEAR   Specific Gravity, Urine 1.001 (L) 1.005 - 1.030   pH 6.0 5.0 - 8.0   Glucose, UA NEGATIVE NEGATIVE mg/dL   Hgb urine dipstick NEGATIVE NEGATIVE   Bilirubin Urine NEGATIVE NEGATIVE   Ketones, ur NEGATIVE NEGATIVE mg/dL   Protein, ur NEGATIVE NEGATIVE mg/dL   Nitrite NEGATIVE NEGATIVE   Leukocytes, UA NEGATIVE NEGATIVE  Amnisure rupture of membrane (rom)not at [facility]     Status: None   Collection Time: 08/01/16 10:49 PM  Result Value Ref Range   Amnisure ROM NEGATIVE   Wet prep, genital     Status: Abnormal   Collection Time: 08/01/16 10:49 PM  Result Value Ref Range   Yeast Wet Prep HPF POC NONE SEEN NONE SEEN   Trich, Wet Prep NONE SEEN NONE SEEN   Clue Cells Wet Prep HPF POC NONE SEEN NONE SEEN   WBC, Wet Prep HPF POC FEW (A) NONE SEEN   Sperm NONE SEEN     Imaging:  [place] Mfm Ob Comp + 14 Wk  Result Date: 07/12/2016 OBSTETRICAL ULTRASOUND: This exam was performed within a [facility]. The OB US report was generated in the AS system, and faxed to the ordering physician.  This report is available in the [employer] PACS. See the AS Obstetric US report via the Image Link.   MAU Course: Amnisure NEG Wet prep NEG UA NEG SSE: NO pooling, FERN NEG Gc/Ct- pending  MDM: Plan of care reviewed with patient, including labs and tests ordered and medical treatment. Not likely rupture of membranes based on physical exam findings and lab results, discussed with patient. May be due to leaking of urine vs physiologic discharge. Precautions given. OK for discharge.   Assessment: 1. Vaginal discharge during pregnancy in second trimester   2. Supervision of high risk pregnancy in second trimester     Plan: Discharge home in stable  condition.  Preterm Labor precautions and fetal kick counts Bleeding precautions given    Allergies as of 08/01/2016      Reactions   Shellfish Allergy Itching, Nausea And Vomiting      Medication List    STOP taking these medications   azithromycin 250 MG tablet Commonly known as:  ZITHROMAX   promethazine 25 MG suppository Commonly known as:  PHENERGAN     TAKE these medications   acetaminophen 500 MG tablet Commonly known as:  TYLENOL Take 1,000 mg by mouth every 6 (six) hours  as needed for moderate pain.   diphenhydramine-acetaminophen 25-500 MG Tabs tablet Commonly known as:  TYLENOL PM Take 2 tablets by mouth at bedtime as needed (headache/sleep).   Doxylamine-Pyridoxine 10-10 MG Tbec Commonly known as:  DICLEGIS 1 tab in AM, 1 tab mid afternoon 2 tabs at bedtime. Max dose 4 tabs daily.   ondansetron 4 MG disintegrating tablet Commonly known as:  ZOFRAN ODT Take 1 tablet (4 mg total) by mouth every 8 (eight) hours as needed for nausea, vomiting or refractory nausea / vomiting.       Jen MowElizabeth Mumaw, DO OB Fellow [facility] 08/01/2016 11:42 PM

## 2016-08-03 NOTE — L&D Delivery Note (Signed)
Delivery Note At 4:05 AM a viable female was delivered via Vaginal, Spontaneous Delivery (Presentation:OA).  APGAR: 8, 9; weight pending.   Placenta status: Delivered intact with gentle traction, calcifications noted on examination of the placenta.  Cord: 3 vessels with the following complications: loose nuchal .  Cord pH: not collected  Anesthesia: Epidural Episiotomy: None Lacerations: None  Suture Repair: N/A Est. Blood Loss (mL): 150    Mom to postpartum.  Baby to Couplet care / Skin to Skin.  Lovena Neighbours, PGY-1 11/24/2016, 4:23 AM  Midwife attestation: I was gloved and present for delivery in its entirety and I agree with the above resident's note.  Donette Larry, CNM 5:32 AM

## 2016-08-04 LAB — GC/CHLAMYDIA PROBE AMP (~~LOC~~) NOT AT ARMC
Chlamydia: NEGATIVE
NEISSERIA GONORRHEA: NEGATIVE

## 2016-08-20 ENCOUNTER — Encounter: Payer: Self-pay | Admitting: Obstetrics & Gynecology

## 2016-08-20 ENCOUNTER — Other Ambulatory Visit: Payer: Self-pay

## 2016-08-20 ENCOUNTER — Ambulatory Visit: Payer: Self-pay

## 2016-08-24 ENCOUNTER — Ambulatory Visit: Payer: Self-pay

## 2016-08-24 ENCOUNTER — Telehealth: Payer: Self-pay | Admitting: Clinical

## 2016-08-24 NOTE — Telephone Encounter (Signed)
Left HIPPA-compliant message to [facility], [phone].   Attempt to move pt appointment from 1:00 on 08-25-15 to 1:30 on 08-25-15.

## 2016-09-01 ENCOUNTER — Other Ambulatory Visit: Payer: 59

## 2016-09-01 ENCOUNTER — Ambulatory Visit (INDEPENDENT_AMBULATORY_CARE_PROVIDER_SITE_OTHER): Payer: 59 | Admitting: Obstetrics & Gynecology

## 2016-09-01 ENCOUNTER — Encounter: Payer: Self-pay | Admitting: Obstetrics & Gynecology

## 2016-09-01 VITALS — BP 125/77 | HR 92 | Wt 160.0 lb

## 2016-09-01 DIAGNOSIS — Z3A29 29 weeks gestation of pregnancy: Secondary | ICD-10-CM

## 2016-09-01 DIAGNOSIS — O99613 Diseases of the digestive system complicating pregnancy, third trimester: Secondary | ICD-10-CM

## 2016-09-01 DIAGNOSIS — K219 Gastro-esophageal reflux disease without esophagitis: Secondary | ICD-10-CM | POA: Insufficient documentation

## 2016-09-01 MED ORDER — PANTOPRAZOLE SODIUM 40 MG PO TBEC: 40.0000 mg | DELAYED_RELEASE_TABLET | Freq: Every day | ORAL | 1 refills | Status: DC

## 2016-09-01 NOTE — Progress Notes (Signed)
   PRENATAL VISIT NOTE  Subjective:  Melanie Serrano is a 26 y.o. G3P1011 at 1967w1d being seen today for ongoing prenatal care.  She is currently monitored for the following issues for this low-risk pregnancy and has MDD (major depressive disorder); MDD (major depressive disorder), recurrent severe, without psychosis (HCC); Supervision of high-risk pregnancy; and GERD (gastroesophageal reflux disease) on her problem list.  Patient reports heartburn and panic attacks.  Contractions: Not present. Vag. Bleeding: None.  Movement: Present. Denies leaking of fluid.   The following portions of the patient's history were reviewed and updated as appropriate: allergies, current medications, past family history, past medical history, past social history, past surgical history and problem list. Problem list updated.  Objective:   Vitals:   09/01/16 0948  BP: 125/77  Pulse: 92  Weight: 160 lb (72.6 kg)    Fetal Status: Fetal Heart Rate (bpm): 137 Fundal Height: 29 cm Movement: Present     General:  Alert, oriented and cooperative. Patient is in no acute distress.  Skin: Skin is warm and dry. No rash noted.   Cardiovascular: Normal heart rate noted  Respiratory: Normal respiratory effort, no problems with respiration noted  Abdomen: Soft, gravid, appropriate for gestational age. Pain/Pressure: Present     Pelvic:  Cervical exam deferred        Extremities: Normal range of motion.  Edema: None  Mental Status: Normal mood and affect. Normal behavior. Normal judgment and thought content.   Assessment and Plan:  Pregnancy: G3P1011 at 6967w1d  1. [redacted] weeks gestation of pregnancy  - Glucose Tolerance, 2 Hours w/1 Hour - HIV antibody - RPR - CBC - Ambulatory referral to Integrated Behavioral Health  2. Gastroesophageal reflux disease, esophagitis presence not specified GERD - pantoprazole (PROTONIX) 40 MG tablet; Take 1 tablet (40 mg total) by mouth daily.  Dispense: 30 tablet; Refill: 1  Preterm  labor symptoms and general obstetric precautions including but not limited to vaginal bleeding, contractions, leaking of fluid and fetal movement were reviewed in detail with the patient. Please refer to After Visit Summary for other counseling recommendations.  Return in about 2 weeks (around 09/15/2016). Reschedule with [facility]  Adam PhenixJames G Quintel Mccalla, MD

## 2016-09-01 NOTE — Progress Notes (Signed)
Patient reports good fetal movement, complains of acid reflux.

## 2016-09-02 LAB — CBC
Hematocrit: 37.7 % (ref 34.0–46.6)
Hemoglobin: 12.4 g/dL (ref 11.1–15.9)
MCH: 30.5 pg (ref 26.6–33.0)
MCHC: 32.9 g/dL (ref 31.5–35.7)
MCV: 93 fL (ref 79–97)
PLATELETS: 163 10*3/uL (ref 150–379)
RBC: 4.06 x10E6/uL (ref 3.77–5.28)
RDW: 13.4 % (ref 12.3–15.4)
WBC: 11.5 10*3/uL — AB (ref 3.4–10.8)

## 2016-09-02 LAB — GLUCOSE TOLERANCE, 2 HOURS W/ 1HR
GLUCOSE, 2 HOUR: 104 mg/dL (ref 65–152)
Glucose, 1 hour: 131 mg/dL (ref 65–179)
Glucose, Fasting: 86 mg/dL (ref 65–91)

## 2016-09-02 LAB — HIV ANTIBODY (ROUTINE TESTING W REFLEX): HIV SCREEN 4TH GENERATION: NONREACTIVE

## 2016-09-02 LAB — RPR: RPR: NONREACTIVE

## 2016-09-15 ENCOUNTER — Ambulatory Visit: Payer: Self-pay

## 2016-09-15 ENCOUNTER — Encounter: Payer: Self-pay | Admitting: Obstetrics & Gynecology

## 2016-09-18 ENCOUNTER — Ambulatory Visit (INDEPENDENT_AMBULATORY_CARE_PROVIDER_SITE_OTHER): Payer: 59 | Admitting: Clinical

## 2016-09-18 ENCOUNTER — Ambulatory Visit (INDEPENDENT_AMBULATORY_CARE_PROVIDER_SITE_OTHER): Payer: 59 | Admitting: Certified Nurse Midwife

## 2016-09-18 VITALS — BP 123/75 | HR 98 | Wt 168.3 lb

## 2016-09-18 DIAGNOSIS — F332 Major depressive disorder, recurrent severe without psychotic features: Secondary | ICD-10-CM

## 2016-09-18 DIAGNOSIS — Z3480 Encounter for supervision of other normal pregnancy, unspecified trimester: Secondary | ICD-10-CM

## 2016-09-18 DIAGNOSIS — O99343 Other mental disorders complicating pregnancy, third trimester: Secondary | ICD-10-CM

## 2016-09-18 DIAGNOSIS — K219 Gastro-esophageal reflux disease without esophagitis: Secondary | ICD-10-CM

## 2016-09-18 NOTE — BH Specialist Note (Signed)
Session Start time: 10:10   End Time: 10:50 Total Time:  40 minutes Type of Service: Behavioral Health - Individual/Family Interpreter: No.   Interpreter Name & Language: n/a # [facility]  SUBJECTIVE: Melanie Serrano is a 26 y.o. female  Pt. was referred by f/u for:  anxiety and depression. Pt. reports the following symptoms/concerns: Pt states that she feels "a little better" since last visit, since moving into mother's home and husband moved into [facility]. Pt main concerns today are about being able to continue receiving BH meds pospartum, and recurring headaches. Duration of problem:  At least 6 months Severity: severe Previous treatment: Yes, inpatient after SI/attempt in May 2017  OBJECTIVE: Mood: Appropriate & Affect: Appropriate Risk of harm to self mor others: Low risk of harm to self today, no SI or plan today; no known risk of harm to others Assessments administered: PHQ9:19/ GAD7; 17  LIFE CONTEXT:  Family & Social: Lives with  6yo and mother; [place] [facility]  School/ Work: Plans to go back to work postpartum  Self-Care: Stopped substance use since last visit; slightly better sleeping and eating since last visit Life changes: Move into mother's home, current pregnancy What is important to pt/family (values): Heathy baby  GOALS ADDRESSED:  -Reduce symptoms of anxiety and depression  INTERVENTIONS: Solution Focused and Strength-based   ASSESSMENT:  Pt currently experiencing Major depressive disorder, severe, no psychotic features.  Pt may benefit from referral to psychiatry and ongoing therapy, as well as brief intervention today.   PLAN: 1. F/U with behavioral health clinician: Two weeks 2. Behavioral Health meds: none 3. Behavioral recommendations:  -Call [facility] to establish care prior to childbirth -Read educational material regarding coping with headaches; consider keeping headache  log to identify triggers 4. Referral: Brief Counseling/Psychotherapy, Psychoeducation and [facility] 5. From scale of 1-10, how likely are you to follow plan: 8  Woc-Behavioral Health Clinician  Behavioral Health Clinician   Warmhandoff: no  Depression screen [facility] 2/9 09/18/2016 07/24/2016  Decreased Interest 2 2  Down, Depressed, Hopeless 2 3  PHQ - 2 Score 4 5  Altered sleeping 3 3  Tired, decreased energy 3 2  Change in appetite 3 3  Feeling bad or failure about yourself  3 3  Trouble concentrating 2 1  Moving slowly or fidgety/restless 1 2  Suicidal thoughts 0 2  PHQ-9 Score 19 21   GAD 7 : Generalized Anxiety Score 09/18/2016 07/24/2016  Nervous, Anxious, on Edge 2 2  Control/stop worrying 3 3  Worry too much - different things 3 3  Trouble relaxing 2 2  Restless 1 1  Easily annoyed or irritable 3 3  Afraid - awful might happen 3 3  Total GAD 7 Score 17 17

## 2016-09-19 NOTE — Patient Instructions (Addendum)
Third Trimester of Pregnancy The third trimester is from week 29 through week 42, months 7 through 9. This trimester is when your unborn baby (fetus) is growing very fast. At the end of the ninth month, the unborn baby is about 20 inches in length. It weighs about 6-10 pounds. Follow these instructions at home:  Avoid all smoking, herbs, and alcohol. Avoid drugs not approved by your doctor.  Do not use any tobacco products, including cigarettes, chewing tobacco, and electronic cigarettes. If you need help quitting, ask your doctor. You may get counseling or other support to help you quit.  Only take medicine as told by your doctor. Some medicines are safe and some are not during pregnancy.  Exercise only as told by your doctor. Stop exercising if you start having cramps.  Eat regular, healthy meals.  Wear a good support bra if your breasts are tender.  Do not use hot tubs, steam rooms, or saunas.  Wear your seat belt when driving.  Avoid raw meat, uncooked cheese, and liter boxes and soil used by cats.  Take your prenatal vitamins.  Take 1500-2000 milligrams of calcium daily starting at the 20th week of pregnancy until you deliver your baby.  Try taking medicine that helps you poop (stool softener) as needed, and if your doctor approves. Eat more fiber by eating fresh fruit, vegetables, and whole grains. Drink enough fluids to keep your pee (urine) clear or pale yellow.  Take warm water baths (sitz baths) to soothe pain or discomfort caused by hemorrhoids. Use hemorrhoid cream if your doctor approves.  If you have puffy, bulging veins (varicose veins), wear support hose. Raise (elevate) your feet for 15 minutes, 3-4 times a day. Limit salt in your diet.  Avoid heavy lifting, wear low heels, and sit up straight.  Rest with your legs raised if you have leg cramps or low back pain.  Visit your dentist if you have not gone during your pregnancy. Use a soft toothbrush to brush your  teeth. Be gentle when you floss.  You can have sex (intercourse) unless your doctor tells you not to.  Do not travel far distances unless you must. Only do so with your doctor's approval.  Take prenatal classes.  Practice driving to the hospital.  Pack your hospital bag.  Prepare the baby's room.  Go to your doctor visits. Get help if:  You are not sure if you are in labor or if your water has broken.  You are dizzy.  You have mild cramps or pressure in your lower belly (abdominal).  You have a nagging pain in your belly area.  You continue to feel sick to your stomach (nauseous), throw up (vomit), or have watery poop (diarrhea).  You have bad smelling fluid coming from your vagina.  You have pain with peeing (urination). Get help right away if:  You have a fever.  You are leaking fluid from your vagina.  You are spotting or bleeding from your vagina.  You have severe belly cramping or pain.  You lose or gain weight rapidly.  You have trouble catching your breath and have chest pain.  You notice sudden or extreme puffiness (swelling) of your face, hands, ankles, feet, or legs.  You have not felt the baby move in over an hour.  You have severe headaches that do not go away with medicine.  You have vision changes. This information is not intended to replace advice given to you by your health care provider. Make   sure you discuss any questions you have with your health care provider. Document Released: 10/14/2009 Document Revised: 12/26/2015 Document Reviewed: 09/20/2012 Elsevier Interactive Patient Education  2017 [employer]  Food Choices for Gastroesophageal Reflux Disease, Adult When you have gastroesophageal reflux disease (GERD), the foods you eat and your eating habits are very important. Choosing the right foods can help ease your discomfort. What guidelines do I need to follow?  Choose fruits, vegetables, whole grains, and low-fat dairy  products.  Choose low-fat meat, fish, and poultry.  Limit fats such as oils, salad dressings, butter, nuts, and avocado.  Keep a food diary. This helps you identify foods that cause symptoms.  Avoid foods that cause symptoms. These may be different for everyone.  Eat small meals often instead of 3 large meals a day.  Eat your meals slowly, in a place where you are relaxed.  Limit fried foods.  Cook foods using methods other than frying.  Avoid drinking alcohol.  Avoid drinking large amounts of liquids with your meals.  Avoid bending over or lying down until 2-3 hours after eating. What foods are not recommended? These are some foods and drinks that may make your symptoms worse: Vegetables  Tomatoes. Tomato juice. Tomato and spaghetti sauce. Chili peppers. Onion and garlic. Horseradish. Fruits  Oranges, grapefruit, and lemon (fruit and juice). Meats  High-fat meats, fish, and poultry. This includes hot dogs, ribs, ham, sausage, salami, and bacon. Dairy  Whole milk and chocolate milk. Sour cream. Cream. Butter. Ice cream. Cream cheese. Drinks  Coffee and tea. Bubbly (carbonated) drinks or energy drinks. Condiments  Hot sauce. Barbecue sauce. Sweets/Desserts  Chocolate and cocoa. Donuts. Peppermint and spearmint. Fats and Oils  High-fat foods. This includes [place] fries and potato chips. Other  Vinegar. Strong spices. This includes black pepper, white pepper, red pepper, cayenne, curry powder, cloves, ginger, and chili powder. The items listed above may not be a complete list of foods and drinks to avoid. Contact your dietitian for more information.  This information is not intended to replace advice given to you by your health care provider. Make sure you discuss any questions you have with your health care provider. Document Released: 01/19/2012 Document Revised: 12/26/2015 Document Reviewed: 05/24/2013 Elsevier Interactive Patient Education  2017 [employer]

## 2016-09-19 NOTE — Progress Notes (Signed)
   PRENATAL VISIT NOTE  Subjective:  Melanie Serrano is a 26 y.o. G3P1011 at 6430w5d being seen today for ongoing prenatal care.  She is currently monitored for the following issues for this low-risk pregnancy and has MDD (major depressive disorder); MDD (major depressive disorder), recurrent severe, without psychosis (HCC); Supervision of other normal pregnancy, antepartum; and GERD (gastroesophageal reflux disease) on her problem list.  Patient reports no complaints.  Contractions: Irregular. Vag. Bleeding: None.  Movement: Present. Denies leaking of fluid.   The following portions of the patient's history were reviewed and updated as appropriate: allergies, current medications, past family history, past medical history, past social history, past surgical history and problem list. Problem list updated.  Objective:   Vitals:   09/18/16 0859  BP: 123/75  Pulse: 98  Weight: 168 lb 4.8 oz (76.3 kg)    Fetal Status: Fetal Heart Rate (bpm): 142 Fundal Height: 30 cm Movement: Present     General:  Alert, oriented and cooperative. Patient is in no acute distress.  Skin: Skin is warm and dry. No rash noted.   Cardiovascular: Normal heart rate noted  Respiratory: Normal respiratory effort, no problems with respiration noted  Abdomen: Soft, gravid, appropriate for gestational age. Pain/Pressure: Present     Pelvic:  Cervical exam deferred        Extremities: Normal range of motion.  Edema: Trace  Mental Status: Normal mood and affect. Normal behavior. Normal judgment and thought content.   Assessment and Plan:  Pregnancy: G3P1011 at 6030w5d  1. Supervision of other normal pregnancy, antepartum       2. Gastroesophageal reflux disease, esophagitis presence not specified     Taking Protonix daily, stable.   3. MDD (major depressive disorder), recurrent severe, without psychosis (HCC)     Has an appointment for counseling today.  Declines anti-dpressants.    Preterm labor symptoms and general  obstetric precautions including but not limited to vaginal bleeding, contractions, leaking of fluid and fetal movement were reviewed in detail with the patient. Please refer to After Visit Summary for other counseling recommendations.  Return in about 2 weeks (around 10/02/2016) for ROB.   Roe Coombsachelle A Marsella Suman, CNM

## 2016-10-02 ENCOUNTER — Encounter: Payer: Self-pay | Admitting: Certified Nurse Midwife

## 2016-10-02 ENCOUNTER — Ambulatory Visit (INDEPENDENT_AMBULATORY_CARE_PROVIDER_SITE_OTHER): Payer: 59 | Admitting: Certified Nurse Midwife

## 2016-10-02 VITALS — BP 127/77 | HR 109 | Wt 168.6 lb

## 2016-10-02 DIAGNOSIS — F1210 Cannabis abuse, uncomplicated: Secondary | ICD-10-CM

## 2016-10-02 DIAGNOSIS — K219 Gastro-esophageal reflux disease without esophagitis: Secondary | ICD-10-CM

## 2016-10-02 DIAGNOSIS — Z3483 Encounter for supervision of other normal pregnancy, third trimester: Secondary | ICD-10-CM

## 2016-10-02 DIAGNOSIS — Z3480 Encounter for supervision of other normal pregnancy, unspecified trimester: Secondary | ICD-10-CM

## 2016-10-02 MED ORDER — VITAFOL GUMMIES 3.33-0.333-34.8 MG PO CHEW: 3.0000 | CHEWABLE_TABLET | Freq: Every day | ORAL | 12 refills | Status: DC

## 2016-10-02 NOTE — Progress Notes (Signed)
   PRENATAL VISIT NOTE  Subjective:  Melanie Serrano is a 26 y.o. G3P1011 at 828w4d being seen today for ongoing prenatal care.  She is currently monitored for the following issues for this low-risk pregnancy and has MDD (major depressive disorder); MDD (major depressive disorder), recurrent severe, without psychosis (HCC); Supervision of other normal pregnancy, antepartum; and GERD (gastroesophageal reflux disease) on her problem list.  Patient reports heartburn, no bleeding, no cramping, no leaking and occasional contractions.  Contractions: Not present. Vag. Bleeding: None.  Movement: Present. Denies leaking of fluid.   The following portions of the patient's history were reviewed and updated as appropriate: allergies, current medications, past family history, past medical history, past social history, past surgical history and problem list. Problem list updated.  Objective:   Vitals:   10/02/16 1049  BP: 127/77  Pulse: (!) 109  Weight: 168 lb 9.6 oz (76.5 kg)    Fetal Status: Fetal Heart Rate (bpm): 145 Fundal Height: 33 cm Movement: Present     General:  Alert, oriented and cooperative. Patient is in no acute distress.  Skin: Skin is warm and dry. No rash noted.   Cardiovascular: Normal heart rate noted  Respiratory: Normal respiratory effort, no problems with respiration noted  Abdomen: Soft, gravid, appropriate for gestational age. Pain/Pressure: Present     Pelvic:  Cervical exam deferred        Extremities: Normal range of motion.  Edema: None  Mental Status: Normal mood and affect. Normal behavior. Normal judgment and thought content.   Assessment and Plan:  Pregnancy: G3P1011 at 6328w4d  1. Gastroesophageal reflux disease, esophagitis presence not specified    Taking Zantac  2. Supervision of other normal pregnancy, antepartum     Normal discomforts of pregnancy - Prenatal Vit-Fe Phos-FA-Omega (VITAFOL GUMMIES) 3.33-0.333-34.8 MG CHEW; Chew 3 tablets by mouth at bedtime.   Dispense: 90 tablet; Refill: 12  Preterm labor symptoms and general obstetric precautions including but not limited to vaginal bleeding, contractions, leaking of fluid and fetal movement were reviewed in detail with the patient. Please refer to After Visit Summary for other counseling recommendations.  Return in about 2 weeks (around 10/16/2016) for ROB.   Roe Coombsachelle A Devonna Oboyle, CNM

## 2016-10-02 NOTE — Progress Notes (Signed)
Patient is doing well today- she did have some contractions 2 days ago.

## 2016-10-16 ENCOUNTER — Encounter: Payer: Self-pay | Admitting: Certified Nurse Midwife

## 2016-10-16 ENCOUNTER — Ambulatory Visit (INDEPENDENT_AMBULATORY_CARE_PROVIDER_SITE_OTHER): Payer: 59 | Admitting: Certified Nurse Midwife

## 2016-10-16 VITALS — BP 121/73 | HR 108 | Wt 174.0 lb

## 2016-10-16 DIAGNOSIS — Z3480 Encounter for supervision of other normal pregnancy, unspecified trimester: Secondary | ICD-10-CM

## 2016-10-16 DIAGNOSIS — Z3483 Encounter for supervision of other normal pregnancy, third trimester: Secondary | ICD-10-CM

## 2016-10-16 NOTE — Progress Notes (Signed)
   PRENATAL VISIT NOTE  Subjective:  Melanie Serrano is a 26 y.o. G3P1011 at 754w4d being seen today for ongoing prenatal care.  She is currently monitored for the following issues for this low-risk pregnancy and has MDD (major depressive disorder); MDD (major depressive disorder), recurrent severe, without psychosis (HCC); Supervision of other normal pregnancy, antepartum; GERD (gastroesophageal reflux disease); and Tetrahydrocannabinol (THC) use disorder, mild, abuse on her problem list.  Patient reports no complaints.  Contractions: Not present. Vag. Bleeding: None.  Movement: Present. Denies leaking of fluid.   The following portions of the patient's history were reviewed and updated as appropriate: allergies, current medications, past family history, past medical history, past social history, past surgical history and problem list. Problem list updated.  Objective:   Vitals:   10/16/16 1048  BP: 121/73  Pulse: (!) 108  Weight: 174 lb (78.9 kg)    Fetal Status: Fetal Heart Rate (bpm): 150 Fundal Height: 35 cm Movement: Present     General:  Alert, oriented and cooperative. Patient is in no acute distress.  Skin: Skin is warm and dry. No rash noted.   Cardiovascular: Normal heart rate noted  Respiratory: Normal respiratory effort, no problems with respiration noted  Abdomen: Soft, gravid, appropriate for gestational age. Pain/Pressure: Present     Pelvic:  Cervical exam deferred        Extremities: Normal range of motion.  Edema: Trace  Mental Status: Normal mood and affect. Normal behavior. Normal judgment and thought content.   Assessment and Plan:  Pregnancy: G3P1011 at 194w4d  1. Supervision of other normal pregnancy, antepartum     Doing well  Preterm labor symptoms and general obstetric precautions including but not limited to vaginal bleeding, contractions, leaking of fluid and fetal movement were reviewed in detail with the patient. Please refer to After Visit Summary for  other counseling recommendations.  Return in about 1 week (around 10/23/2016) for ROB.   Roe Coombsachelle A Desteni Piscopo, CNM

## 2016-10-21 ENCOUNTER — Encounter: Payer: Self-pay | Admitting: Certified Nurse Midwife

## 2016-10-21 ENCOUNTER — Other Ambulatory Visit (HOSPITAL_COMMUNITY)
Admission: RE | Admit: 2016-10-21 | Discharge: 2016-10-21 | Disposition: A | Discharge status: Home / Self Care | Payer: 59 | Source: Ambulatory Visit | Attending: Certified Nurse Midwife | Admitting: Certified Nurse Midwife

## 2016-10-21 ENCOUNTER — Ambulatory Visit (INDEPENDENT_AMBULATORY_CARE_PROVIDER_SITE_OTHER): Payer: 59 | Admitting: Certified Nurse Midwife

## 2016-10-21 VITALS — BP 122/84 | HR 93 | Wt 174.2 lb

## 2016-10-21 DIAGNOSIS — F332 Major depressive disorder, recurrent severe without psychotic features: Secondary | ICD-10-CM

## 2016-10-21 DIAGNOSIS — K219 Gastro-esophageal reflux disease without esophagitis: Secondary | ICD-10-CM

## 2016-10-21 DIAGNOSIS — Z3480 Encounter for supervision of other normal pregnancy, unspecified trimester: Secondary | ICD-10-CM

## 2016-10-21 DIAGNOSIS — F1210 Cannabis abuse, uncomplicated: Secondary | ICD-10-CM

## 2016-10-21 DIAGNOSIS — O99323 Drug use complicating pregnancy, third trimester: Secondary | ICD-10-CM

## 2016-10-21 DIAGNOSIS — Z3483 Encounter for supervision of other normal pregnancy, third trimester: Secondary | ICD-10-CM | POA: Diagnosis not present

## 2016-10-21 DIAGNOSIS — O99613 Diseases of the digestive system complicating pregnancy, third trimester: Secondary | ICD-10-CM

## 2016-10-21 DIAGNOSIS — Z3A36 36 weeks gestation of pregnancy: Secondary | ICD-10-CM | POA: Diagnosis not present

## 2016-10-21 DIAGNOSIS — B951 Streptococcus, group B, as the cause of diseases classified elsewhere: Secondary | ICD-10-CM | POA: Insufficient documentation

## 2016-10-21 DIAGNOSIS — O26843 Uterine size-date discrepancy, third trimester: Secondary | ICD-10-CM

## 2016-10-21 DIAGNOSIS — O99343 Other mental disorders complicating pregnancy, third trimester: Secondary | ICD-10-CM

## 2016-10-21 LAB — OB RESULTS CONSOLE GC/CHLAMYDIA: GC PROBE AMP, GENITAL: NEGATIVE

## 2016-10-21 NOTE — Addendum Note (Signed)
Addended by: Natale MilchSTALLING, BRITTANY D on: 10/21/2016 02:51 PM   Modules accepted: Orders

## 2016-10-21 NOTE — Progress Notes (Signed)
   PRENATAL VISIT NOTE  Subjective:  Melanie Serrano is a 26 y.o. G3P1011 at 7965w2d being seen today for ongoing prenatal care.  She is currently monitored for the following issues for this low-risk pregnancy and has MDD (major depressive disorder); MDD (major depressive disorder), recurrent severe, without psychosis (HCC); Supervision of other normal pregnancy, antepartum; GERD (gastroesophageal reflux disease); Tetrahydrocannabinol (THC) use disorder, mild, abuse; and Positive GBS test on her problem list.  Patient reports no complaints.  Contractions: Not present. Vag. Bleeding: None.  Movement: Present. Denies leaking of fluid.   The following portions of the patient's history were reviewed and updated as appropriate: allergies, current medications, past family history, past medical history, past social history, past surgical history and problem list. Problem list updated.  Objective:   Vitals:   10/21/16 1326  BP: 122/84  Pulse: 93  Weight: 174 lb 3.2 oz (79 kg)    Fetal Status: Fetal Heart Rate (bpm): 146 Fundal Height: 32 cm Movement: Present  Presentation: Transverse  General:  Alert, oriented and cooperative. Patient is in no acute distress.  Skin: Skin is warm and dry. No rash noted.   Cardiovascular: Normal heart rate noted  Respiratory: Normal respiratory effort, no problems with respiration noted  Abdomen: Soft, gravid, appropriate for gestational age. Pain/Pressure: Absent     Pelvic:  Cervical exam performed Dilation: Closed Effacement (%): 0 Station: Ballotable  Extremities: Normal range of motion.  Edema: Trace  Mental Status: Normal mood and affect. Normal behavior. Normal judgment and thought content.   Assessment and Plan:  Pregnancy: G3P1011 at 5165w2d  1. Supervision of other normal pregnancy, antepartum    - US MFM OB FOLLOW UP; Future  2. Tetrahydrocannabinol (THC) use disorder, mild, abuse      3. Gastroesophageal reflux disease, esophagitis presence not  specified      4. MDD (major depressive disorder), recurrent severe, without psychosis (HCC)     Not on medication, [place] in a recovery center in [place]   5. Uterine size date discrepancy, antepartum condition, third trimester     Size < Dates, transverse position on leopold's.  - US MFM OB FOLLOW UP; Future  Preterm labor symptoms and general obstetric precautions including but not limited to vaginal bleeding, contractions, leaking of fluid and fetal movement were reviewed in detail with the patient. Please refer to After Visit Summary for other counseling recommendations.  Return in about 1 week (around 10/28/2016) for ROB.   Roe Coombsachelle A Dea Bitting, CNM

## 2016-10-21 NOTE — Progress Notes (Signed)
Patient is in the office, reports good fetal movement. 

## 2016-10-22 ENCOUNTER — Encounter (HOSPITAL_COMMUNITY): Payer: Self-pay

## 2016-10-22 ENCOUNTER — Ambulatory Visit (HOSPITAL_COMMUNITY)
Admission: RE | Admit: 2016-10-22 | Discharge: 2016-10-22 | Disposition: A | Discharge status: Home / Self Care | Payer: 59 | Source: Ambulatory Visit | Attending: Certified Nurse Midwife | Admitting: Certified Nurse Midwife

## 2016-10-22 DIAGNOSIS — Z3A36 36 weeks gestation of pregnancy: Secondary | ICD-10-CM | POA: Diagnosis not present

## 2016-10-22 DIAGNOSIS — O26843 Uterine size-date discrepancy, third trimester: Secondary | ICD-10-CM | POA: Insufficient documentation

## 2016-10-22 DIAGNOSIS — Z3480 Encounter for supervision of other normal pregnancy, unspecified trimester: Secondary | ICD-10-CM

## 2016-10-22 DIAGNOSIS — Z3689 Encounter for other specified antenatal screening: Secondary | ICD-10-CM | POA: Diagnosis not present

## 2016-10-22 LAB — CERVICOVAGINAL ANCILLARY ONLY
Bacterial vaginitis: NEGATIVE
CANDIDA VAGINITIS: NEGATIVE
Chlamydia: NEGATIVE
Neisseria Gonorrhea: NEGATIVE
TRICH (WINDOWPATH): NEGATIVE

## 2016-10-26 ENCOUNTER — Other Ambulatory Visit: Payer: Self-pay | Admitting: Certified Nurse Midwife

## 2016-10-26 DIAGNOSIS — Z3480 Encounter for supervision of other normal pregnancy, unspecified trimester: Secondary | ICD-10-CM

## 2016-10-29 ENCOUNTER — Encounter: Payer: Self-pay | Admitting: Certified Nurse Midwife

## 2016-10-29 ENCOUNTER — Ambulatory Visit (INDEPENDENT_AMBULATORY_CARE_PROVIDER_SITE_OTHER): Payer: 59 | Admitting: Certified Nurse Midwife

## 2016-10-29 DIAGNOSIS — Z3483 Encounter for supervision of other normal pregnancy, third trimester: Secondary | ICD-10-CM

## 2016-10-29 DIAGNOSIS — Z3480 Encounter for supervision of other normal pregnancy, unspecified trimester: Secondary | ICD-10-CM

## 2016-10-29 NOTE — Progress Notes (Signed)
PRENATAL VISIT NOTE  Subjective:  Melanie Serrano is a 26 y.o. G3P1011 at 6833w3d being seen today for ongoing prenatal care.  She is currently monitored for the following issues for this low-risk pregnancy and has MDD (major depressive disorder); MDD (major depressive disorder), recurrent severe, without psychosis (HCC); Supervision of other normal pregnancy, antepartum; GERD (gastroesophageal reflux disease); Tetrahydrocannabinol (THC) use disorder, mild, abuse; and Positive GBS test on her problem list.  Patient reports bilateral ear pain., no bleeding,no cramping,no leaking and no contractions. Contractions: Not present. Vag. Bleeding: None.  Movement: Present. Denies leaking of fluid.   The following portions of the patient's history were reviewed and updated as appropriate: allergies, current medications, past family history, past medical history, past social history, past surgical history and problem list. Problem list updated.  Objective:   Vitals:   10/29/16 1558  BP: 124/80  Pulse: (!) 106  Weight: 175 lb 9.6 oz (79.7 kg)    Fetal Status: Fetal Heart Rate (bpm): 135 Fundal Height: 34 cm Movement: Present     General:  Alert, oriented and cooperative. Patient is in no acute distress.  Skin: Skin is warm and dry. No rash noted.   Cardiovascular: Normal heart rate noted  Respiratory: Normal respiratory effort, no problems with respiration noted  Abdomen: Soft, gravid, appropriate for gestational age. Pain/Pressure: Absent     Pelvic:  Cervical exam performed        Extremities: Normal range of motion.  Edema: None  Mental Status: Normal mood and affect. Normal behavior. Normal judgment and thought content.   Assessment and Plan:  Pregnancy: G3P1011 at 3833w3d  1. Supervision of other normal pregnancy, antepartum Patient reports doing well besides ear pain. External auditory canals examined, tympanic membrane clear,neutral position,scar tissue noted in left ear canal, no foreign  body or signs of infection noted.    2. Tetrahydrocannabinol (THC) use disorder, mild, abuse      3.. Gastroesophageal reflux disease, esophagitis presence not specified      4. MDD (major depressive disorder), recurrent severe, without psychosis (HCC)     Not on medication,significant other in recovery  5. Uterine size date discrepancy, antepartum condition, third trimester  Indications   [redacted] weeks gestation of pregnancy                Z3A.36   Determine Fetal presentation by ultrasound     Z36.89   Uterine size-date discrepancy, third trimester O26.843  ---------------------------------------------------------------------- OB History  Blood Type:            Height:  5'3"   Weight (lb):  143      BMI:   25.33  Gravidity:    3         Term:   1         SAB:   1  Living:       1 ---------------------------------------------------------------------- Fetal Evaluation  Num Of Fetuses:     1  Fetal Heart         151  Num Of Fetuses:     1  Fetal Heart         151  Rate(bpm):  Cardiac Activity:   Observed  Presentation:       Cephalic  Placenta:           Posterior, above cervical os  P. Cord Insertion:  Previously Visualized  Amniotic Fluid  AFI FV:      Subjectively within normal limits  AFI Sum(cm)     %  Tile       Largest Pocket(cm)  18.72           71          5.56  RUQ(cm)       RLQ(cm)       LUQ(cm)        LLQ(cm)  5.56          4.82          4.44           3.9 ---------------------------------------------------------------------- Biometry  BPD:      88.9  mm     G. Age:  36w 0d         48  %    CI:        71.09   %   70 - 86                                                          FL/HC:      20.3   %   20.1 - 22.1  HC:      335.9  mm     G. Age:  38w 3d         71  %    HC/AC:      0.98       0.93 - 1.11  AC:      342.9  mm     G. Age:  38w 1d         94  %    FL/BPD:     76.7   %   71 - 87  FL:       68.2  mm     G. Age:  35w 0d         16  %    FL/AC:      19.9   %    20 - 24  Est. FW:    3151  gm    6 lb 15 oz      80  % ---------------------------------------------------------------------- Gestational Age  LMP:           36w 3d       Date:   02/10/16                 EDD:   11/16/16  U/S Today:     36w 6d                                        EDD:   11/13/16  Best:          36w 3d    Det. By:   LMP  (02/10/16)          EDD:   11/16/16 ---------------------------------------------------------------------- Anatomy  Cranium:               Appears normal         Aortic Arch:            Previously seen  Cavum:                 Appears normal         Ductal Arch:  Previously seen  Ventricles:            Appears normal         Diaphragm:              Appears normal  Choroid Plexus:        Appears normal         Stomach:                Appears normal, left                                                                        sided  Cerebellum:            Appears normal         Abdomen:                Appears normal  Posterior Fossa:       Previously seen        Abdominal Wall:         Previously seen  Nuchal Fold:           Previously seen        Cord Vessels:           Previously seen  Face:                  Orbits and profile     Kidneys:                Appear normal                         previously seen  Lips:                  Previously seen        Bladder:                Appears normal  Thoracic:              Appears normal         Spine:                  LTD views                                                                        Previously seen  Heart:                 Appears normal         Upper Extremities:      Previously seen                         (4CH, axis, and situs  RVOT:                  Appears normal         Lower Extremities:      Previously seen  LVOT:  Previously seen  Other:  Fetus appears to be a female. Heels and 5th digit previously seen.          Technically difficult due to fetal  position. ---------------------------------------------------------------------- Cervix Uterus Adnexa  Cervix  Not visualized (advanced GA >29wks)  Uterus  No abnormality visualized.  Left Ovary  Within normal limits.  Right Ovary  Within normal limits.  Adnexa:       No abnormality visualized. ---------------------------------------------------------------------- Impression  SIUP at 36+3 weeks  Cephalic presentation  Normal interval anatomy; anatomic survey complete  Normal amniotic fluid volume  Appropriate interval growth with EFW at the 80th %tile ---------------------------------------------------------------------- Recommendations  Follow-up as clinically indicated ----------------------------------------------------------------------                 Particia Nearing, MD Electronically Signed Final Report   10/23/2016 06:50 pm ----------------------------------------------------------------------     Term labor symptoms and general obstetric precautions including but not limited to vaginal bleeding, contractions, leaking of fluid and fetal movement were reviewed in detail with the patient. Please refer to After Visit Summary for other counseling recommendations.  Return in about 1 week (around 11/05/2016) for ROB.   Elinor Parkinson, Student-MidWife

## 2016-10-29 NOTE — Progress Notes (Signed)
Patient reports good fetal movement, denies pain/contractions. 

## 2016-11-05 ENCOUNTER — Ambulatory Visit (INDEPENDENT_AMBULATORY_CARE_PROVIDER_SITE_OTHER): Payer: 59 | Admitting: Certified Nurse Midwife

## 2016-11-05 ENCOUNTER — Encounter: Payer: Self-pay | Admitting: Certified Nurse Midwife

## 2016-11-05 VITALS — BP 130/77 | HR 105 | Wt 178.6 lb

## 2016-11-05 DIAGNOSIS — Z3480 Encounter for supervision of other normal pregnancy, unspecified trimester: Secondary | ICD-10-CM

## 2016-11-05 DIAGNOSIS — B951 Streptococcus, group B, as the cause of diseases classified elsewhere: Secondary | ICD-10-CM

## 2016-11-05 DIAGNOSIS — K219 Gastro-esophageal reflux disease without esophagitis: Secondary | ICD-10-CM

## 2016-11-05 NOTE — Progress Notes (Signed)
Pain in L side and in back

## 2016-11-05 NOTE — Progress Notes (Signed)
   PRENATAL VISIT NOTE  Subjective:  Melanie Serrano is a 26 y.o. G3P1011 at [redacted]w[redacted]d being seen today for ongoing prenatal care.  She is currently monitored for the following issues for this high-risk pregnancy and has MDD (major depressive disorder); MDD (major depressive disorder), recurrent severe, without psychosis (HCC); Supervision of other normal pregnancy, antepartum; GERD (gastroesophageal reflux disease); Tetrahydrocannabinol (THC) use disorder, mild, abuse; and Positive GBS test on her problem list.  Patient reports no complaints.  Contractions: Not present. Vag. Bleeding: None.  Movement: Present. Denies leaking of fluid.   The following portions of the patient's history were reviewed and updated as appropriate: allergies, current medications, past family history, past medical history, past social history, past surgical history and problem list. Problem list updated.  Objective:   Vitals:   11/05/16 1326  BP: 130/77  Pulse: (!) 105  Weight: 178 lb 9.6 oz (81 kg)    Fetal Status: Fetal Heart Rate (bpm): 142 Fundal Height: 36 cm Movement: Present     General:  Alert, oriented and cooperative. Patient is in no acute distress.  Skin: Skin is warm and dry. No rash noted.   Cardiovascular: Normal heart rate noted  Respiratory: Normal respiratory effort, no problems with respiration noted  Abdomen: Soft, gravid, appropriate for gestational age. Pain/Pressure: Present     Pelvic:  Cervical exam performed Dilation: Fingertip Dilation: 0.5 Effacement (%): 0 Effacement (%) 0  Station:-3  Extremities: Normal range of motion.  Edema: None  Mental Status: Normal mood and affect. Normal behavior. Normal judgment and thought content.   Assessment and Plan:  Pregnancy: G3P1011 at [redacted]w[redacted]d  1. Supervision of other normal pregnancy, antepartum Patient doing well  2. Positive GBS test Prophylactic Antibiotics for labor  Term labor symptoms and general obstetric precautions including but not  limited to vaginal bleeding, contractions, leaking of fluid and fetal movement were reviewed in detail with the patient. Please refer to After Visit Summary for other counseling recommendations.  1wk followup ROB  Roe Coombs, CNM

## 2016-11-11 ENCOUNTER — Inpatient Hospital Stay (HOSPITAL_COMMUNITY)
Admission: AD | Admit: 2016-11-11 | Discharge: 2016-11-11 | Disposition: A | Discharge status: Home / Self Care | Payer: 59 | Source: Ambulatory Visit | Attending: Family Medicine | Admitting: Family Medicine

## 2016-11-11 ENCOUNTER — Encounter (HOSPITAL_COMMUNITY): Payer: Self-pay | Admitting: *Deleted

## 2016-11-11 DIAGNOSIS — Z3480 Encounter for supervision of other normal pregnancy, unspecified trimester: Secondary | ICD-10-CM

## 2016-11-11 DIAGNOSIS — O479 False labor, unspecified: Secondary | ICD-10-CM | POA: Diagnosis not present

## 2016-11-11 DIAGNOSIS — Z3A00 Weeks of gestation of pregnancy not specified: Secondary | ICD-10-CM | POA: Diagnosis not present

## 2016-11-11 NOTE — Discharge Instructions (Signed)
Fetal Movement Counts Patient Name: ________________________________________________ Patient Due Date: ____________________ What is a fetal movement count? A fetal movement count is the number of times that you feel your baby move during a certain amount of time. This may also be called a fetal kick count. A fetal movement count is recommended for every pregnant woman. You may be asked to start counting fetal movements as early as week 28 of your pregnancy. Pay attention to when your baby is most active. You may notice your baby's sleep and wake cycles. You may also notice things that make your baby move more. You should do a fetal movement count:  When your baby is normally most active.  At the same time each day. A good time to count movements is while you are resting, after having something to eat and drink. How do I count fetal movements? 1. Find a quiet, comfortable area. Sit, or lie down on your side. 2. Write down the date, the start time and stop time, and the number of movements that you felt between those two times. Take this information with you to your health care visits. 3. For 2 hours, count kicks, flutters, swishes, rolls, and jabs. You should feel at least 10 movements during 2 hours. 4. You may stop counting after you have felt 10 movements. 5. If you do not feel 10 movements in 2 hours, have something to eat and drink. Then, keep resting and counting for 1 hour. If you feel at least 4 movements during that hour, you may stop counting. Contact a health care provider if:  You feel fewer than 4 movements in 2 hours.  Your baby is not moving like he or she usually does. Date: ____________ Start time: ____________ Stop time: ____________ Movements: ____________ Date: ____________ Start time: ____________ Stop time: ____________ Movements: ____________ Date: ____________ Start time: ____________ Stop time: ____________ Movements: ____________ Date: ____________ Start time:  ____________ Stop time: ____________ Movements: ____________ Date: ____________ Start time: ____________ Stop time: ____________ Movements: ____________ Date: ____________ Start time: ____________ Stop time: ____________ Movements: ____________ Date: ____________ Start time: ____________ Stop time: ____________ Movements: ____________ Date: ____________ Start time: ____________ Stop time: ____________ Movements: ____________ Date: ____________ Start time: ____________ Stop time: ____________ Movements: ____________ This information is not intended to replace advice given to you by your health care provider. Make sure you discuss any questions you have with your health care provider. Document Released: 08/19/2006 Document Revised: 03/18/2016 Document Reviewed: 08/29/2015 Elsevier Interactive Patient Education  2017 [employer] Braxton Hicks Contractions Contractions of the uterus can occur throughout pregnancy, but they are not always a sign that you are in labor. You may have practice contractions called Braxton Hicks contractions. These false labor contractions are sometimes confused with true labor. What are Braxton Hicks contractions? Braxton Hicks contractions are tightening movements that occur in the muscles of the uterus before labor. Unlike true labor contractions, these contractions do not result in opening (dilation) and thinning of the cervix. Toward the end of pregnancy (32-34 weeks), Braxton Hicks contractions can happen more often and may become stronger. These contractions are sometimes difficult to tell apart from true labor because they can be very uncomfortable. You should not feel embarrassed if you go to the hospital with false labor. Sometimes, the only way to tell if you are in true labor is for your health care provider to look for changes in the cervix. The health care provider will do a physical exam and may monitor your contractions. If you   are not in true labor, the exam  should show that your cervix is not dilating and your water has not broken. If there are no prenatal problems or other health problems associated with your pregnancy, it is completely safe for you to be sent home with false labor. You may continue to have Braxton Hicks contractions until you go into true labor. How can I tell the difference between true labor and false labor?  Differences  False labor  Contractions last 30-70 seconds.: Contractions are usually shorter and not as strong as true labor contractions.  Contractions become very regular.: Contractions are usually irregular.  Discomfort is usually felt in the top of the uterus, and it spreads to the lower abdomen and low back.: Contractions are often felt in the front of the lower abdomen and in the groin.  Contractions do not go away with walking.: Contractions may go away when you walk around or change positions while lying down.  Contractions usually become more intense and increase in frequency.: Contractions get weaker and are shorter-lasting as time goes on.  The cervix dilates and gets thinner.: The cervix usually does not dilate or become thin. Follow these instructions at home:  Take over-the-counter and prescription medicines only as told by your health care provider.  Keep up with your usual exercises and follow other instructions from your health care provider.  Eat and drink lightly if you think you are going into labor.  If Braxton Hicks contractions are making you uncomfortable:  Change your position from lying down or resting to walking, or change from walking to resting.  Sit and rest in a tub of warm water.  Drink enough fluid to keep your urine clear or pale yellow. Dehydration may cause these contractions.  Do slow and deep breathing several times an hour.  Keep all follow-up prenatal visits as told by your health care provider. This is important. Contact a health care provider if:  You have a  fever.  You have continuous pain in your abdomen. Get help right away if:  Your contractions become stronger, more regular, and closer together.  You have fluid leaking or gushing from your vagina.  You pass blood-tinged mucus (bloody show).  You have bleeding from your vagina.  You have low back pain that you never had before.  You feel your baby's head pushing down and causing pelvic pressure.  Your baby is not moving inside you as much as it used to. Summary  Contractions that occur before labor are called Braxton Hicks contractions, false labor, or practice contractions.  Braxton Hicks contractions are usually shorter, weaker, farther apart, and less regular than true labor contractions. True labor contractions usually become progressively stronger and regular and they become more frequent.  Manage discomfort from Braxton Hicks contractions by changing position, resting in a warm bath, drinking plenty of water, or practicing deep breathing. This information is not intended to replace advice given to you by your health care provider. Make sure you discuss any questions you have with your health care provider. Document Released: 07/20/2005 Document Revised: 06/08/2016 Document Reviewed: 06/08/2016 Elsevier Interactive Patient Education  2017 [employer]  

## 2016-11-11 NOTE — MAU Note (Signed)
PT  SAYS   [facility]  HURT  SINCE 1042PM.  VE LAST Thursday-   FT.     DENIES HSV AND  MRSA.   GBS- POSITIVE

## 2016-11-13 ENCOUNTER — Ambulatory Visit (INDEPENDENT_AMBULATORY_CARE_PROVIDER_SITE_OTHER): Payer: 59 | Admitting: Certified Nurse Midwife

## 2016-11-13 ENCOUNTER — Encounter: Payer: Self-pay | Admitting: Certified Nurse Midwife

## 2016-11-13 VITALS — BP 115/77 | HR 99 | Wt 179.7 lb

## 2016-11-13 DIAGNOSIS — Z3483 Encounter for supervision of other normal pregnancy, third trimester: Secondary | ICD-10-CM

## 2016-11-13 DIAGNOSIS — Z3480 Encounter for supervision of other normal pregnancy, unspecified trimester: Secondary | ICD-10-CM

## 2016-11-13 DIAGNOSIS — B951 Streptococcus, group B, as the cause of diseases classified elsewhere: Secondary | ICD-10-CM

## 2016-11-13 NOTE — Progress Notes (Signed)
   PRENATAL VISIT NOTE  Subjective:  Melanie Serrano is a 26 y.o. G3P1011 at [redacted]w[redacted]d being seen today for ongoing prenatal care.  She is currently monitored for the following issues for this low-risk pregnancy and has MDD (major depressive disorder); MDD (major depressive disorder), recurrent severe, without psychosis (HCC); Supervision of other normal pregnancy, antepartum; GERD (gastroesophageal reflux disease); Tetrahydrocannabinol (THC) use disorder, mild, abuse; and Positive GBS test on her problem list.  Patient reports no complaints.  Contractions: Not present. Vag. Bleeding: None.  Movement: Present. Denies leaking of fluid.   The following portions of the patient's history were reviewed and updated as appropriate: allergies, current medications, past family history, past medical history, past social history, past surgical history and problem list. Problem list updated.  Objective:   Vitals:   11/13/16 0911  BP: 115/77  Pulse: 99  Weight: 179 lb 11.2 oz (81.5 kg)    Fetal Status: Fetal Heart Rate (bpm): 148 Fundal Height: 34 cm Movement: Present  Presentation: Vertex  General:  Alert, oriented and cooperative. Patient is in no acute distress.  Skin: Skin is warm and dry. No rash noted.   Cardiovascular: Normal heart rate noted  Respiratory: Normal respiratory effort, no problems with respiration noted  Abdomen: Soft, gravid, appropriate for gestational age. Pain/Pressure: Absent     Pelvic:  Cervical exam performed Dilation: 1.5 Effacement (%): 20 Station: -3  Extremities: Normal range of motion.  Edema: None  Mental Status: Normal mood and affect. Normal behavior. Normal judgment and thought content.   Assessment and Plan:  Pregnancy: G3P1011 at [redacted]w[redacted]d  1. Positive GBS test     PCN for labor/delivery  2. Supervision of other normal pregnancy, antepartum     Doing well.   Term labor symptoms and general obstetric precautions including but not limited to vaginal bleeding,  contractions, leaking of fluid and fetal movement were reviewed in detail with the patient. Please refer to After Visit Summary for other counseling recommendations.  Return in about 1 week (around 11/20/2016) for NST, IOL.   Roe Coombs, CNM

## 2016-11-13 NOTE — Progress Notes (Signed)
Patient reports good fetal movement, denies pain. 

## 2016-11-20 ENCOUNTER — Telehealth (HOSPITAL_COMMUNITY): Payer: Self-pay | Admitting: *Deleted

## 2016-11-20 ENCOUNTER — Ambulatory Visit (INDEPENDENT_AMBULATORY_CARE_PROVIDER_SITE_OTHER): Payer: 59 | Admitting: Certified Nurse Midwife

## 2016-11-20 VITALS — BP 123/82 | HR 94 | Wt 179.7 lb

## 2016-11-20 DIAGNOSIS — B951 Streptococcus, group B, as the cause of diseases classified elsewhere: Secondary | ICD-10-CM

## 2016-11-20 DIAGNOSIS — Z3480 Encounter for supervision of other normal pregnancy, unspecified trimester: Secondary | ICD-10-CM

## 2016-11-20 DIAGNOSIS — O480 Post-term pregnancy: Secondary | ICD-10-CM

## 2016-11-20 DIAGNOSIS — Z3483 Encounter for supervision of other normal pregnancy, third trimester: Secondary | ICD-10-CM | POA: Diagnosis not present

## 2016-11-20 NOTE — Telephone Encounter (Signed)
Preadmission screen  

## 2016-11-20 NOTE — Progress Notes (Signed)
   PRENATAL VISIT NOTE  Subjective:  Melanie Serrano is a 26 y.o. G3P1011 at [redacted]w[redacted]d being seen today for ongoing prenatal care.  She is currently monitored for the following issues for this low-risk pregnancy and has MDD (major depressive disorder); MDD (major depressive disorder), recurrent severe, without psychosis (HCC); Supervision of other normal pregnancy, antepartum; GERD (gastroesophageal reflux disease); Tetrahydrocannabinol (THC) use disorder, mild, abuse; and Positive GBS test on her problem list.  Patient reports no complaints.  Contractions: Irritability. Vag. Bleeding: None.  Movement: Present. Denies leaking of fluid.   The following portions of the patient's history were reviewed and updated as appropriate: allergies, current medications, past family history, past medical history, past social history, past surgical history and problem list. Problem list updated.  Objective:   Vitals:   11/20/16 1031  BP: 123/82  Pulse: 94  Weight: 179 lb 11.4 oz (81.5 kg)    Fetal Status: Fetal Heart Rate (bpm): NST; reactive Fundal Height: 34 cm Movement: Present  Presentation: Vertex  General:  Alert, oriented and cooperative. Patient is in no acute distress.  Skin: Skin is warm and dry. No rash noted.   Cardiovascular: Normal heart rate noted  Respiratory: Normal respiratory effort, no problems with respiration noted  Abdomen: Soft, gravid, appropriate for gestational age. Pain/Pressure: Absent     Pelvic:  Cervical exam performed Dilation: 1.5 Effacement (%): 20 Station: -3  Posterior.   Extremities: Normal range of motion.  Edema: Trace  Mental Status: Normal mood and affect. Normal behavior. Normal judgment and thought content.  NST: + accels, no decels, moderate variability, Cat. 1 tracing. No contractions on toco.   Assessment and Plan:  Pregnancy: G3P1011 at [redacted]w[redacted]d  1. Post-term pregnancy, 40-42 weeks of gestation     Reactive NST.  - Fetal nonstress test; Future  2.  Supervision of other normal pregnancy, antepartum     Doing well. Postdates management: IOL scheduled.  Reports increased vaginal discharge after NST: negative fern, negative Nitrazine.   3. Positive GBS test    PCN for labor/delivery  Term labor symptoms and general obstetric precautions including but not limited to vaginal bleeding, contractions, leaking of fluid and fetal movement were reviewed in detail with the patient. Please refer to After Visit Summary for other counseling recommendations.  Return in about 4 weeks (around 12/18/2016) for Postpartum.   Roe Coombs, CNM

## 2016-11-23 ENCOUNTER — Inpatient Hospital Stay (HOSPITAL_COMMUNITY): Payer: 59 | Admitting: Anesthesiology

## 2016-11-23 ENCOUNTER — Inpatient Hospital Stay (HOSPITAL_COMMUNITY)
Admission: RE | Admit: 2016-11-23 | Discharge: 2016-11-26 | DRG: 775 | Disposition: A | Discharge status: Home / Self Care | Payer: 59 | Source: Ambulatory Visit | Attending: Obstetrics & Gynecology | Admitting: Obstetrics & Gynecology

## 2016-11-23 ENCOUNTER — Encounter (HOSPITAL_COMMUNITY): Payer: Self-pay

## 2016-11-23 DIAGNOSIS — O99344 Other mental disorders complicating childbirth: Secondary | ICD-10-CM | POA: Diagnosis present

## 2016-11-23 DIAGNOSIS — Z8249 Family history of ischemic heart disease and other diseases of the circulatory system: Secondary | ICD-10-CM

## 2016-11-23 DIAGNOSIS — Z833 Family history of diabetes mellitus: Secondary | ICD-10-CM

## 2016-11-23 DIAGNOSIS — F17210 Nicotine dependence, cigarettes, uncomplicated: Secondary | ICD-10-CM | POA: Diagnosis present

## 2016-11-23 DIAGNOSIS — F329 Major depressive disorder, single episode, unspecified: Secondary | ICD-10-CM | POA: Diagnosis present

## 2016-11-23 DIAGNOSIS — O99334 Smoking (tobacco) complicating childbirth: Secondary | ICD-10-CM | POA: Diagnosis present

## 2016-11-23 DIAGNOSIS — O480 Post-term pregnancy: Secondary | ICD-10-CM | POA: Diagnosis present

## 2016-11-23 DIAGNOSIS — Z3A41 41 weeks gestation of pregnancy: Secondary | ICD-10-CM | POA: Diagnosis not present

## 2016-11-23 DIAGNOSIS — O99824 Streptococcus B carrier state complicating childbirth: Secondary | ICD-10-CM | POA: Diagnosis present

## 2016-11-23 DIAGNOSIS — Z3480 Encounter for supervision of other normal pregnancy, unspecified trimester: Secondary | ICD-10-CM

## 2016-11-23 LAB — CBC
HCT: 36.2 % (ref 36.0–46.0)
HEMOGLOBIN: 12.6 g/dL (ref 12.0–15.0)
MCH: 30.9 pg (ref 26.0–34.0)
MCHC: 34.8 g/dL (ref 30.0–36.0)
MCV: 88.7 fL (ref 78.0–100.0)
Platelets: 209 10*3/uL (ref 150–400)
RBC: 4.08 MIL/uL (ref 3.87–5.11)
RDW: 13.7 % (ref 11.5–15.5)
WBC: 11.2 10*3/uL — ABNORMAL HIGH (ref 4.0–10.5)

## 2016-11-23 LAB — RPR: RPR Ser Ql: NONREACTIVE

## 2016-11-23 LAB — TYPE AND SCREEN
ABO/RH(D): O POS
Antibody Screen: NEGATIVE

## 2016-11-23 LAB — OB RESULTS CONSOLE GBS: GBS: POSITIVE

## 2016-11-23 LAB — ABO/RH: ABO/RH(D): O POS

## 2016-11-23 MED ORDER — OXYTOCIN 40 UNITS IN LACTATED RINGERS INFUSION - SIMPLE MED
2.5000 [IU]/h | INTRAVENOUS | Status: DC
  Administered 2016-11-24: 2.5 [IU]/h via INTRAVENOUS

## 2016-11-23 MED ORDER — ONDANSETRON HCL 4 MG/2ML IJ SOLN: 4.0000 mg | Freq: Four times a day (QID) | INTRAMUSCULAR | Status: DC | PRN

## 2016-11-23 MED ORDER — PENICILLIN G POTASSIUM 5000000 UNITS IJ SOLR
5.0000 10*6.[IU] | Freq: Once | INTRAVENOUS | Status: AC
  Administered 2016-11-23: 5 10*6.[IU] via INTRAVENOUS
  Filled 2016-11-23: qty 5

## 2016-11-23 MED ORDER — TERBUTALINE SULFATE 1 MG/ML IJ SOLN
0.2500 mg | Freq: Once | INTRAMUSCULAR | Status: DC | PRN
  Filled 2016-11-23: qty 1

## 2016-11-23 MED ORDER — PHENYLEPHRINE 40 MCG/ML (10ML) SYRINGE FOR IV PUSH (FOR BLOOD PRESSURE SUPPORT)
80.0000 ug | PREFILLED_SYRINGE | INTRAVENOUS | Status: DC | PRN
  Filled 2016-11-23: qty 10
  Filled 2016-11-23: qty 5

## 2016-11-23 MED ORDER — FLEET ENEMA 7-19 GM/118ML RE ENEM: 1.0000 | ENEMA | RECTAL | Status: DC | PRN

## 2016-11-23 MED ORDER — OXYCODONE-ACETAMINOPHEN 5-325 MG PO TABS: 2.0000 | ORAL_TABLET | ORAL | Status: DC | PRN

## 2016-11-23 MED ORDER — FENTANYL 2.5 MCG/ML BUPIVACAINE 1/10 % EPIDURAL INFUSION (WH - ANES)
14.0000 mL/h | INTRAMUSCULAR | Status: DC | PRN
  Administered 2016-11-23: 14 mL/h via EPIDURAL
  Filled 2016-11-23: qty 100

## 2016-11-23 MED ORDER — OXYTOCIN 40 UNITS IN LACTATED RINGERS INFUSION - SIMPLE MED
1.0000 m[IU]/min | INTRAVENOUS | Status: DC
  Administered 2016-11-23: 2 m[IU]/min via INTRAVENOUS
  Filled 2016-11-23: qty 1000

## 2016-11-23 MED ORDER — MISOPROSTOL 25 MCG QUARTER TABLET
25.0000 ug | ORAL_TABLET | Freq: Once | ORAL | Status: AC
  Administered 2016-11-23: 25 ug via VAGINAL
  Filled 2016-11-23: qty 1

## 2016-11-23 MED ORDER — OXYCODONE-ACETAMINOPHEN 5-325 MG PO TABS: 1.0000 | ORAL_TABLET | ORAL | Status: DC | PRN

## 2016-11-23 MED ORDER — PENICILLIN G POT IN DEXTROSE 60000 UNIT/ML IV SOLN
3.0000 10*6.[IU] | INTRAVENOUS | Status: DC
  Administered 2016-11-23 – 2016-11-24 (×4): 3 10*6.[IU] via INTRAVENOUS
  Filled 2016-11-23 (×7): qty 50

## 2016-11-23 MED ORDER — EPHEDRINE 5 MG/ML INJ
10.0000 mg | INTRAVENOUS | Status: DC | PRN
  Filled 2016-11-23: qty 2

## 2016-11-23 MED ORDER — LACTATED RINGERS IV SOLN: 500.0000 mL | INTRAVENOUS | Status: DC | PRN

## 2016-11-23 MED ORDER — LIDOCAINE HCL (PF) 1 % IJ SOLN
30.0000 mL | INTRAMUSCULAR | Status: DC | PRN
  Filled 2016-11-23: qty 30

## 2016-11-23 MED ORDER — OXYTOCIN BOLUS FROM INFUSION
500.0000 mL | Freq: Once | INTRAVENOUS | Status: AC
  Administered 2016-11-24: 500 mL via INTRAVENOUS

## 2016-11-23 MED ORDER — SOD CITRATE-CITRIC ACID 500-334 MG/5ML PO SOLN
30.0000 mL | ORAL | Status: DC | PRN
  Administered 2016-11-23: 30 mL via ORAL
  Filled 2016-11-23: qty 15

## 2016-11-23 MED ORDER — ACETAMINOPHEN 325 MG PO TABS
650.0000 mg | ORAL_TABLET | ORAL | Status: DC | PRN
  Administered 2016-11-24: 650 mg via ORAL
  Filled 2016-11-23: qty 2

## 2016-11-23 MED ORDER — LACTATED RINGERS IV SOLN
INTRAVENOUS | Status: DC
  Administered 2016-11-23 – 2016-11-24 (×2): via INTRAVENOUS

## 2016-11-23 MED ORDER — LIDOCAINE HCL (PF) 1 % IJ SOLN
INTRAMUSCULAR | Status: DC | PRN
  Administered 2016-11-23: 7 mL via EPIDURAL
  Administered 2016-11-23: 5 mL via EPIDURAL

## 2016-11-23 MED ORDER — LACTATED RINGERS IV SOLN
500.0000 mL | Freq: Once | INTRAVENOUS | Status: AC
  Administered 2016-11-23: 500 mL via INTRAVENOUS

## 2016-11-23 MED ORDER — DIPHENHYDRAMINE HCL 50 MG/ML IJ SOLN
12.5000 mg | INTRAMUSCULAR | Status: DC | PRN
  Administered 2016-11-23: 12.5 mg via INTRAVENOUS
  Filled 2016-11-23: qty 1

## 2016-11-23 MED ORDER — PHENYLEPHRINE 40 MCG/ML (10ML) SYRINGE FOR IV PUSH (FOR BLOOD PRESSURE SUPPORT)
80.0000 ug | PREFILLED_SYRINGE | INTRAVENOUS | Status: DC | PRN
  Filled 2016-11-23: qty 5

## 2016-11-23 MED ORDER — FENTANYL CITRATE (PF) 100 MCG/2ML IJ SOLN: 100.0000 ug | INTRAMUSCULAR | Status: DC | PRN

## 2016-11-23 NOTE — H&P (Signed)
Leia Coletti is a 26 y.o. female presenting for IOL for postdates pregnancy at 41.[redacted] wks gestation by LMP.  OB History    Gravida Para Term Preterm AB Living   SAB TAB Ectopic Multiple Live Births   1       1     Past Medical History:  Diagnosis Date  . Anxiety   . Depression    Past Surgical History:  Procedure Laterality Date  . WISDOM TOOTH EXTRACTION     Family History: family history includes COPD in her mother; Cancer in her father and paternal grandmother; Diabetes in her paternal grandfather; Heart attack in her maternal grandfather; Hypertension in her father and mother; Melanoma in her father. Social History:  reports that she has been smoking Cigarettes.  She has been smoking about 0.50 packs per day. She uses smokeless tobacco. She reports that she does not drink alcohol or use drugs.     Maternal Diabetes: No Genetic Screening: Normal Maternal Ultrasounds/Referrals: Normal Fetal Ultrasounds or other Referrals:  None Maternal Substance Abuse:  Yes:  Type: Marijuana prior to initial prenatal visit Significant Maternal Medications:  Meds include: Zantac Significant Maternal Lab Results:  Lab values include: Group B Strep positive Other Comments:  h/o major depressive disorder  Review of Systems  Constitutional: Negative.   HENT: Negative.   Eyes: Negative.   Respiratory: Negative.   Cardiovascular: Negative.   Gastrointestinal: Positive for heartburn.  Genitourinary: Negative.   Musculoskeletal: Negative.   Skin: Negative.   Neurological: Negative.   Endo/Heme/Allergies: Negative.   Psychiatric/Behavioral: Negative.    Maternal Medical History:  Reason for admission: IOL for postdates  Fetal activity: Perceived fetal activity is normal.   Last perceived fetal movement was within the past hour.    Prenatal complications: Substance abuse: (+) THC use prior to initial prenatal visit.   Prenatal Complications - Diabetes: none.    Dilation:  2.5 Effacement (%): 80 Station: -1 Exam by:: R. Arita Miss CNM Blood pressure 129/72, pulse 96, temperature 98.5 F (36.9 C), temperature source Oral, resp. rate 20, height  (1.6 m), weight 81.6 kg (180 lb), last menstrual period 02/10/2016. Maternal Exam:  Uterine Assessment: Contraction strength is mild.  Contraction frequency is irregular.   Abdomen: Patient reports no abdominal tenderness. Estimated fetal weight is 6lbs 15 oz at 36.3 wks.   Fetal presentation: vertex  Introitus: Normal vulva. Normal vagina.  Ferning test: not done.  Nitrazine test: not done. Amniotic fluid character: not assessed.  Pelvis: adequate for delivery.   Cervix: Cervix evaluated by digital exam.     Physical Exam  Constitutional: She is oriented to person, place, and time. She appears well-developed and well-nourished.  HENT:  Head: Normocephalic.  Eyes: Pupils are equal, round, and reactive to light.  Neck: Normal range of motion.  Cardiovascular: Normal rate, regular rhythm, normal heart sounds and intact distal pulses.   Respiratory: Effort normal and breath sounds normal.  GI: Soft. Bowel sounds are normal.  Genitourinary: Vagina normal.  Genitourinary Comments: Gravid, S=D, cx: 2.5/80%/-1, cytotec placed, no VB or LOF  Musculoskeletal: Normal range of motion.  Neurological: She is alert and oriented to person, place, and time. She has normal reflexes.  Skin: Skin is warm and dry.  Psychiatric: She has a normal mood and affect. Her behavior is normal. Judgment and thought content normal.    Prenatal labs: ABO, Rh: O/Positive/-- (11/02 1425) Antibody: Negative (11/02 1425) Rubella: 1.08 (11/02  1425) RPR: Non Reactive (01/30 1130)  HBsAg: Negative (11/02 1425)  HIV: Non Reactive (01/30 1130)  GBS:   POSITIVE  Assessment/Plan: G3P1011 at 41.[redacted] wks gestation / IOL for Postdates Pregnancy Category I FHR tracing  Cytotec 25 mcg pv / Pitocin 2x2 4 hrs after placement of Cytotec PCN 5  million units, then 2.5 million units every 4 hrs Routine L&D orders    Raelyn Mora, Judie Petit MSN, CNM 11/23/2016, 9:26 AM

## 2016-11-23 NOTE — Anesthesia Procedure Notes (Signed)
Epidural Patient location during procedure: OB Start time: 11/23/2016 8:32 PM End time: 11/23/2016 8:35 PM  Staffing Anesthesiologist: Leilani Able Performed: anesthesiologist   Preanesthetic Checklist Completed: patient identified, surgical consent, pre-op evaluation, timeout performed, IV checked, risks and benefits discussed and monitors and equipment checked  Epidural Patient position: sitting Prep: site prepped and draped and DuraPrep Patient monitoring: continuous pulse ox and blood pressure Approach: midline Location: L3-L4 Injection technique: LOR air  Needle:  Needle type: Tuohy  Needle gauge: 17 G Needle length: 9 cm and 9 Needle insertion depth: 5 cm cm Catheter type: closed end flexible Catheter size: 19 Gauge Catheter at skin depth: 10 cm Test dose: negative and Other  Assessment Sensory level: T9 Events: blood not aspirated, injection not painful, no injection resistance, negative IV test and no paresthesia

## 2016-11-23 NOTE — Anesthesia Preprocedure Evaluation (Addendum)
Anesthesia Evaluation  Patient identified by MRN, date of birth, ID band Patient awake    Reviewed: Allergy & Precautions, H&P , NPO status , Patient's Chart, lab work & pertinent test results  Airway Mallampati: I  TM Distance: >3 FB Neck ROM: full    Dental no notable dental hx.    Pulmonary Current Smoker,    Pulmonary exam normal        Cardiovascular negative cardio ROS Normal cardiovascular exam     Neuro/Psych    GI/Hepatic Neg liver ROS,   Endo/Other  negative endocrine ROS  Renal/GU negative Renal ROS     Musculoskeletal negative musculoskeletal ROS (+)   Abdominal (+) + obese,   Peds  Hematology negative hematology ROS (+)   Anesthesia Other Findings   Reproductive/Obstetrics (+) Pregnancy                            Anesthesia Physical Anesthesia Plan  ASA: II  Anesthesia Plan: Epidural   Post-op Pain Management:    Induction:   Airway Management Planned:   Additional Equipment:   Intra-op Plan:   Post-operative Plan:   Informed Consent: I have reviewed the patients History and Physical, chart, labs and discussed the procedure including the risks, benefits and alternatives for the proposed anesthesia with the patient or authorized representative who has indicated his/her understanding and acceptance.     Plan Discussed with:   Anesthesia Plan Comments:         Anesthesia Quick Evaluation

## 2016-11-23 NOTE — Progress Notes (Signed)
   Subjective: Maurisha Mongeau is a 26 y.o. G3P1011 at [redacted]w[redacted]d by LMP admitted for induction of labor due to Post dates. Due date 11/16/2016.  Objective: BP (!) 141/80   Pulse 88   Temp 98.7 F (37.1 C) (Oral)   Resp 20   Ht  (1.6 m)   Wt 81.6 kg (180 lb)   LMP 02/10/2016   SpO2 100%   BMI 31.89 kg/m  No intake/output data recorded. No intake/output data recorded.  FHT:  FHR: 135 bpm, variability: moderate,  accelerations:  Present,  decelerations:  Absent UC:   regular, every 1.5-2.5 minutes SVE:   Dilation: 4 Effacement (%): 80 Station: -1 Exam by:: Raliegh Ip RN  Labs: Lab Results  Component Value Date   WBC 11.2 (H) 11/23/2016   HGB 12.6 11/23/2016   HCT 36.2 11/23/2016   MCV 88.7 11/23/2016   PLT 209 11/23/2016    Assessment / Plan: Induction of labor due to postterm,  progressing well on pitocin  Labor: Progressing on Pitocin, will continue to increase then AROM Preeclampsia:  n/a Fetal Wellbeing:  Category I Pain Control:  Nitrous Oxide I/D:  n/a Anticipated MOD:  NSVD  Raelyn Mora, Judie Petit MSN, CNM 11/23/2016, 6:45 PM

## 2016-11-23 NOTE — Anesthesia Pain Management Evaluation Note (Signed)
  CRNA Pain Management Visit Note  Patient: Melanie Serrano, 26 y.o., female  "Hello I am a member of the anesthesia team at [facility]. We have an anesthesia team available at all times to provide care throughout the hospital, including epidural management and anesthesia for C-section. I don't know your plan for the delivery whether it a natural birth, water birth, IV sedation, nitrous supplementation, doula or epidural, but we want to meet your pain goals."   1.Was your pain managed to your expectations on prior hospitalizations?   Yes   2.What is your expectation for pain management during this hospitalization?     Labor support without medications, Epidural, IV pain meds and Nitrous Oxide  3.How can we help you reach that goal? Patient unsure of plan, but aware of options.   Record the patient's initial score and the patient's pain goal.   Pain: 0  Pain Goal: 7 [facility] wants you to be able to say your pain was always managed very well.  Melanie Serrano L 11/23/2016

## 2016-11-23 NOTE — Progress Notes (Signed)
   Subjective: Melanie Serrano is a 26 y.o. G3P1011 at [redacted]w[redacted]d by LMP admitted for induction of labor due to Post dates. Due date 11/16/2016.  Objective: BP 129/72   Pulse 96   Temp 98.5 F (36.9 C) (Oral)   Resp 18   Ht  (1.6 m)   Wt 81.6 kg (180 lb)   LMP 02/10/2016   BMI 31.89 kg/m  No intake/output data recorded. No intake/output data recorded.  FHT:  FHR: 135 bpm, variability: moderate,  accelerations:  Present,  decelerations:  Absent UC:   regular, every 2-4 minutes SVE:   Dilation: 3 Effacement (%): 80 Station: -1 Exam by:: Carloyn Jaeger CNM  Labs: Lab Results  Component Value Date   WBC 11.2 (H) 11/23/2016   HGB 12.6 11/23/2016   HCT 36.2 11/23/2016   MCV 88.7 11/23/2016   PLT 209 11/23/2016    Assessment / Plan: Induction of labor due to postterm  Labor: Progressing normally Preeclampsia:  n/a Fetal Wellbeing:  Category I Pain Control:  Labor support without medications I/D:  n/a Anticipated MOD:  NSVD  Raelyn Mora, Judie Petit MSN, CNM 11/23/2016, 12:58 PM

## 2016-11-24 ENCOUNTER — Encounter (HOSPITAL_COMMUNITY): Payer: Self-pay

## 2016-11-24 ENCOUNTER — Inpatient Hospital Stay (HOSPITAL_COMMUNITY): Payer: 59

## 2016-11-24 DIAGNOSIS — O480 Post-term pregnancy: Secondary | ICD-10-CM

## 2016-11-24 DIAGNOSIS — Z3A41 41 weeks gestation of pregnancy: Secondary | ICD-10-CM

## 2016-11-24 MED ORDER — SENNOSIDES-DOCUSATE SODIUM 8.6-50 MG PO TABS
2.0000 | ORAL_TABLET | ORAL | Status: DC
  Administered 2016-11-26: 2 via ORAL
  Filled 2016-11-24 (×2): qty 2

## 2016-11-24 MED ORDER — SIMETHICONE 80 MG PO CHEW: 80.0000 mg | CHEWABLE_TABLET | ORAL | Status: DC | PRN

## 2016-11-24 MED ORDER — BENZOCAINE-MENTHOL 20-0.5 % EX AERO: 1.0000 "application " | INHALATION_SPRAY | CUTANEOUS | Status: DC | PRN

## 2016-11-24 MED ORDER — ONDANSETRON HCL 4 MG PO TABS: 4.0000 mg | ORAL_TABLET | ORAL | Status: DC | PRN

## 2016-11-24 MED ORDER — COCONUT OIL OIL: 1.0000 "application " | TOPICAL_OIL | Status: DC | PRN

## 2016-11-24 MED ORDER — DIBUCAINE 1 % RE OINT: 1.0000 "application " | TOPICAL_OINTMENT | RECTAL | Status: DC | PRN

## 2016-11-24 MED ORDER — ZOLPIDEM TARTRATE 5 MG PO TABS: 5.0000 mg | ORAL_TABLET | Freq: Every evening | ORAL | Status: DC | PRN

## 2016-11-24 MED ORDER — WITCH HAZEL-GLYCERIN EX PADS: 1.0000 "application " | MEDICATED_PAD | CUTANEOUS | Status: DC | PRN

## 2016-11-24 MED ORDER — ONDANSETRON HCL 4 MG/2ML IJ SOLN: 4.0000 mg | INTRAMUSCULAR | Status: DC | PRN

## 2016-11-24 MED ORDER — ACETAMINOPHEN 325 MG PO TABS
650.0000 mg | ORAL_TABLET | ORAL | Status: DC | PRN
  Administered 2016-11-25 – 2016-11-26 (×3): 650 mg via ORAL
  Filled 2016-11-24 (×3): qty 2

## 2016-11-24 MED ORDER — IBUPROFEN 600 MG PO TABS
600.0000 mg | ORAL_TABLET | Freq: Four times a day (QID) | ORAL | Status: DC
  Administered 2016-11-24 – 2016-11-26 (×9): 600 mg via ORAL
  Filled 2016-11-24 (×9): qty 1

## 2016-11-24 MED ORDER — PRENATAL MULTIVITAMIN CH
1.0000 | ORAL_TABLET | Freq: Every day | ORAL | Status: DC
  Administered 2016-11-24 – 2016-11-26 (×3): 1 via ORAL
  Filled 2016-11-24 (×3): qty 1

## 2016-11-24 MED ORDER — TETANUS-DIPHTH-ACELL PERTUSSIS 5-2.5-18.5 LF-MCG/0.5 IM SUSP: 0.5000 mL | Freq: Once | INTRAMUSCULAR | Status: DC

## 2016-11-24 MED ORDER — DIPHENHYDRAMINE HCL 25 MG PO CAPS
25.0000 mg | ORAL_CAPSULE | Freq: Four times a day (QID) | ORAL | Status: DC | PRN
  Administered 2016-11-24: 25 mg via ORAL
  Filled 2016-11-24: qty 1

## 2016-11-24 NOTE — Lactation Note (Signed)
This note was copied from a baby's chart. Lactation Consultation Note: This is mothers second child. She reports that she breastfed and supplemented with formula first for 7 months. Mother reports that this infant is feeding well. Infant was just bathed and is now skin to skin with mother. Mother has lots of visitors in room. Reviewed breastfeeding teaching, cluster feeding, cue base feeding and feed at least 8-12 times in 24 hours. Mother was given [facility] Brochure and advised to follow up with [facility] for pre and post weights. Mother receptive to all teaching.  Patient Name: Melanie Serrano Date: 11/24/2016 Reason for consult: Initial assessment   Maternal Data Does the patient have breastfeeding experience prior to this delivery?: Yes  Feeding Feeding Type: Breast Fed Length of feed: 25 min  LATCH Score/Interventions Latch: Grasps breast easily, tongue down, lips flanged, rhythmical sucking.  Audible Swallowing: A few with stimulation Intervention(s): Skin to skin  Type of Nipple: Everted at rest and after stimulation  Comfort (Breast/Nipple): Soft / non-tender     Hold (Positioning): No assistance needed to correctly position infant at breast.  LATCH Score: 9  Lactation Tools Discussed/Used     Consult Status Consult Status: Follow-up Date: 11/24/16 Follow-up type: In-patient    Stevan Born [place] 11/24/2016, 1:39 PM

## 2016-11-24 NOTE — Progress Notes (Signed)
Pt requested for benadryl to be able to sleep. Ambein offered, pt declined. Benadryl given. Pt has family at bedside and does not want to be bothered. Pt told to call this rn when up or if needs anything.

## 2016-11-24 NOTE — Anesthesia Postprocedure Evaluation (Signed)
Anesthesia Post Note  Patient: Melanie Serrano  Procedure(s) Performed: * No procedures listed *  Patient location during evaluation: Mother Baby Anesthesia Type: Epidural Level of consciousness: patient remains intubated per anesthesia plan Pain management: pain level controlled Vital Signs Assessment: post-procedure vital signs reviewed and stable Respiratory status: spontaneous breathing, nonlabored ventilation and respiratory function stable Cardiovascular status: stable Postop Assessment: no headache, no backache, epidural receding and patient able to bend at knees Anesthetic complications: no        Last Vitals:  Vitals:   11/24/16 0540 11/24/16 0622  BP:  (!) 119/59  Pulse:  76  Resp:  20  Temp: 37.9 C 37.5 C    Last Pain:  Vitals:   11/24/16 0622  TempSrc: Oral  PainSc: 0-No pain   Pain Goal:                 Rica Records

## 2016-11-24 NOTE — Progress Notes (Signed)
Labor Progress Note Nannie Starzyk is a 26 y.o. G3P1011 at [redacted]w[redacted]d presented for IOL for post dates  S:  Comfortable with epidural, feeling some pressure.  O:  BP 126/70   Pulse 81   Temp 98.4 F (36.9 C) (Oral)   Resp 18   Ht  (1.6 m)   Wt 81.6 kg (180 lb)   LMP 02/10/2016   SpO2 100%   BMI 31.89 kg/m  EFM: baseline 150 bpm/ mod variability/ no accels/ variables decels  Toco: 2-3 SVE: 6/80/-1 Pitocin: 6 mu/min  A/P: 26 y.o. G3P1011 [redacted]w[redacted]d  1. Labor: protracted active stage 2. FWB: Cat II 3. Pain: epidural Anticipate labor progression and SVD.  Donette Larry, CNM 2:02 AM

## 2016-11-25 NOTE — Clinical Social Work Maternal (Signed)
CLINICAL SOCIAL WORK MATERNAL/CHILD NOTE  Patient Details  Name: Melanie Serrano MRN: [id number] Date of Birth: 10/21/1990  Date:  11/25/2016  Clinical Social Worker Initiating Note:  Rashee Marschall Boyd-Gilyard Date/ Time Initiated:  11/25/16/1306     Child's Name:  Melanie Serrano   Legal Guardian:  Mother (Bio Father is Melanie Serrano and Husband is Melanie Serrano 07/16/88)   Need for Interpreter:  None   Date of Referral:  11/25/16     Reason for Referral:  Behavioral Health Issues, including SI , Current Substance Use/Substance Use During Pregnancy  (hx of anxiety/dep and hx of THC use during pregnancy.)   Referral Source:  Central Nursery   Address:  [address] [place] [address]  Phone number:  [phone]   Household Members:  Self, Minor Children   Natural Supports (not living in the home):  Immediate Family, Spouse/significant other, Extended Family, Friends, Parent (Husband's family will also be a source of support for MOB and family.)   Professional Supports: None   Employment: Unemployed   Type of Work:     Education:  High school graduate   Financial Resources:  Medicaid   Other Resources:  WIC, Food Stamps    Cultural/Religious Considerations Which May Impact Care:  Per MOB's Face Sheet, MOB is Non-Denominational.   Strengths:  Ability to meet basic needs , Understanding of illness, Home prepared for child    Risk Factors/Current Problems:  Substance Use , Mental Health Concerns    Cognitive State:  Alert , Able to Concentrate , Linear Thinking , Insightful , Goal Oriented    Mood/Affect:  Tearful , Interested , Comfortable , Relaxed    CSW Assessment: CSW met with MOB to complete an assessment for hx of marijuana use, and hx of anxiety/depression.  When CSW arrived MOB was bonding with infant as evident by engaging in skin to skin.  MOB introduced her room guest as MOB's husband, but not the father of the baby.  MOB provided CSW with permission  to complete MOB's assessment while husband was present.  MOB was engaged, polite, and receptive to meeting with CSW.  MOB was also attentive to infant during the assessment and responded appropriately to infant's cues.   CSW inquired about MOB's MH hx, and MOB acknowledged a hx of anxiety and depression. MOB communicated that MOB felt like MOB's MH symptoms were situational and related them to MOB's husband's substance addiction. MOB became tearful when sharing her thoughts and feelings about discovering her husband was an addict and his drug of choice was heroin.  CSW validated and normalized MOB's thoughts and feelings.  MOB's husband also became tearful and reported he is currently in recovery and is residing in the [facility] in [place], [place].  MOB is currently not in counseling and discontinued the use of Zoloft in May 2017.  MOB denies any MH symptoms currently and denied SI and HI.  MOB did not present with any acute mental health symptoms, and demonstrated self-awareness and insight related to her mental health needs.  CSW provided MOB with resources for outpatient counseling and encouraged MOB to make contact.  CSW educated MOB about PPD. CSW informed MOB of possible supports and interventions to decrease PPD.  CSW also encouraged MOB to seek medical attention if needed for increased signs and symptoms for PPD. MOB acknowledged PPD with MOB's oldest child (Melanie Serrano (M) 02/02/2010) and symptoms consisted of daily crying, hypersomnia, and lack of interest in infant.  MOB reports being prescribed   Zoloft after 6 months. MOB stated it was during this time MOB discovered MOB's husband's addiction.  CSW provided MOB with a PPD checklist and encouraged MOB to utilize it weekly and to consult with MOB's OB if a need arise.   CSW inquired about MOB's substance use and MOB acknowledge the use of marijuana, but denied the use of any other illicit drug.  MOB reports using marijuana to assist MOB with decreasing  MOB's nausea and vomiting.  MOB communicated MOB's last use was 07/17/2016. CSW informed MOB of the hospital's policy regarding substance use. CSW thanked MOB for being honest, and informed MOB that the infant's UDS was negative and CSW will monitor the infant's CDS. CSW made MOB that if infant CDS is positive without an explanation, CSW will make a report to [place] CPS. MOB was understanding and did not have any questions or concerns. CSW offered resources and referrals for SA treatment and MOB declined.    CSW provided family with SIDS and education.  CSW also provided the family with CSW contact information and encouraged them to contact CSW if a need arise or if they had any questions or concerns.   CSW Plan/Description:  Information/Referral to Community Resources , Patient/Family Education , No Further Intervention Required/No Barriers to Discharge (CSW will continue to monitor infant's CDS and will make a CPS report if warranted. )   Mourad Cwikla Boyd-Gilyard, MSW, LCSW Clinical Social Work [phone]  Ayan Yankey D BOYD-GILYARD, LCSW 11/25/2016, 1:13 PM 

## 2016-11-25 NOTE — Lactation Note (Signed)
This note was copied from a baby's chart. Lactation Consultation Note P2 mom had issues with low milk supply with first child.  Supplemented first child early on. Child is now 26 years old.  This infant has received 3 bottles of formula thus far.  Mom states infant acts very fussy at breast and continually comes on and off and does not act satisfied.  LC came to room to check on mom/infant but infant had 10 cc of sim. 19 via bottle at noon.  At 1430 mom called for assistance in feeding.  LC encouraged dad to undress baby and change his diaper in order to wake him up.   Infant to breast, latched well but pulled off shortly after latching.  Assisted in latching then observed as baby pulled off again.  Mom expresses that she really is concerned about baby not getting anything at the breast.  LC worked with mom to hand express and only got one drop from each nipple.  LC presented the SNS with 5 fr. Tube and syringe.  Mom seemed receptive, Syringe filled with 10 mls sim 19 and tip was inserted at breast into corner of infant mouth.  Infant had good strong rhythmic jaw movement and very effectively and efficiently took the 10ml via SNS and continued to nurse for a couple of minutes after formula was gone,.  Mom was very pleased.  LC instructed mom to feed infant with SNS and if assistance is needed to call.  Feeding guideline sheet provided.  Mom also was set up a pump by [facility] and encouraged mom to postpump after infant breastfeeds, every three hours, in order to stimulate milk supply.  LC stressed this would be key in ensuring that her milk comes in well.  Mom understands.  Cleaning, DEBP parts, and storage all reviewed by LC.  Mom to call out for further concerns or questions.  Patient Name: Boy Geonna Lockyer [id number] Date: 11/25/2016 Reason for consult: Follow-up assessment   Maternal Data    Feeding Feeding Type: Breast Milk with Formula added Nipple Type: Slow - flow Length of feed: 15 min  LATCH  Score/Interventions Latch: Repeated attempts needed to sustain latch, nipple held in mouth throughout feeding, stimulation needed to elicit sucking reflex. Intervention(s): Assist with latch;Adjust position;Breast compression  Audible Swallowing: A few with stimulation Intervention(s): Skin to skin;Hand expression (only able to hand express a few drops) Intervention(s): Skin to skin;Hand expression  Type of Nipple: Everted at rest and after stimulation  Comfort (Breast/Nipple): Soft / non-tender     Hold (Positioning): Assistance needed to correctly position infant at breast and maintain latch. Intervention(s): Breastfeeding basics reviewed;Position options;Support Pillows;Skin to skin  LATCH Score: 7  Lactation Tools Discussed/Used Pump Review: Setup, frequency, and cleaning;Milk Storage Initiated by:: Threasa Alpha Date initiated:: 11/25/16   Consult Status Consult Status: Follow-up Date: 11/26/16 Follow-up type: In-patient    Maryruth Hancock [facility] 11/25/2016, 4:03 PM

## 2016-11-25 NOTE — Progress Notes (Signed)
Post Partum Day #1 Subjective: up ad lib, voiding and tolerating PO  Objective: Blood pressure (!) 92/59, pulse 60, temperature 98.1 F (36.7 C), resp. rate 18, height  (1.6 m), weight 180 lb (81.6 kg), last menstrual period 02/10/2016, SpO2 100 %, unknown if currently breastfeeding.  Physical Exam:  General: alert, cooperative and no distress Lochia: appropriate Uterine Fundus: firm Incision: n/a DVT Evaluation: No evidence of DVT seen on physical exam. No cords or calf tenderness. No significant calf/ankle edema.   Recent Labs  11/23/16 0835  HGB 12.6  HCT 36.2    Assessment/Plan: Plan for discharge tomorrow, Breastfeeding and Contraception OCPs.     LOS: 2 days   Roe Coombs, CNM 11/25/2016, 7:14 AM

## 2016-11-26 MED ORDER — IBUPROFEN 600 MG PO TABS: 600.0000 mg | ORAL_TABLET | Freq: Four times a day (QID) | ORAL | 2 refills | Status: AC

## 2016-11-26 NOTE — Progress Notes (Signed)
Post Partum Day #2 Subjective: no complaints, up ad lib, voiding and tolerating PO  Objective: Blood pressure 118/70, pulse 72, temperature 98.3 F (36.8 C), temperature source Oral, resp. rate 18, height  (1.6 m), weight 180 lb (81.6 kg), last menstrual period 02/10/2016, SpO2 100 %, unknown if currently breastfeeding.  Physical Exam:  General: alert, cooperative and no distress Lochia: appropriate Uterine Fundus: firm Incision: n/a DVT Evaluation: No evidence of DVT seen on physical exam. No cords or calf tenderness. No significant calf/ankle edema.   Recent Labs  11/23/16 0835  HGB 12.6  HCT 36.2    Assessment/Plan: Discharge home, Breastfeeding, Social Work consult and Contraception OCPs.  SW consult completed.     LOS: 3 days   Roe Coombs, CNM 11/26/2016, 7:21 AM

## 2016-11-26 NOTE — Lactation Note (Signed)
This note was copied from a baby's chart. Lactation Consultation Note: Infant remains a baby patient. Mother is bottle and breastfeeding. Mother is using a #5 fr feeding tube when breadtfeeding. Advised mother to page for assistance with latching infant.  Mother pumped this am and got more milk. She was very excited to see increase . She is supplementing infant with formula and small amts of milk.  Advised mother to feed infant well to soften breast and use good breast massage. Mother to post pump every 2-3 hours. Mother has electric pump at home.   Patient Name: Boy Zahriah Roes [id number] Date: 11/26/2016 Reason for consult: Follow-up assessment   Maternal Data    Feeding Feeding Type: Bottle Fed - Formula Nipple Type: Slow - flow  LATCH Score/Interventions                      Lactation Tools Discussed/Used     Consult Status Consult Status: Follow-up Date: 11/26/16 Follow-up type: In-patient    Stevan Born [facility] 11/26/2016, 9:29 AM

## 2016-11-26 NOTE — Discharge Summary (Signed)
OB Discharge Summary     Patient Name: Melanie Serrano DOB: Sep 19, 1990 MRN: [id number]  Date of admission: 11/23/2016 Delivering MD: Lovena Neighbours   Date of discharge: 11/26/2016  Admitting diagnosis: INDUCTION Intrauterine pregnancy: [redacted]w[redacted]d     Secondary diagnosis:  Active Problems:   Post-dates pregnancy  Additional problems: Hx of depression: not on meds currently.  Social stressors with WUJ:WJXB use.      Discharge diagnosis: Term Pregnancy Delivered                                                                                                Post partum procedures:n/a  Augmentation: Pitocin  Complications: None  Hospital course:  Induction of Labor With Vaginal Delivery   26 y.o. yo [id number] at [redacted]w[redacted]d was admitted to the hospital 11/23/2016 for induction of labor.  Indication for induction: Postdates.  Patient had an uncomplicated labor course as follows: Membrane Rupture Time/Date: 1:24 AM ,11/24/2016   Intrapartum Procedures: Episiotomy: None [1]                                         Lacerations:  None [1]  Patient had delivery of a Viable infant.  Information for the patient's newborn:  Talesha, Ellithorpe [[id number]]  Delivery Method: Vaginal, Spontaneous Delivery (Filed from Delivery Summary)   11/24/2016  Details of delivery can be found in separate delivery note.  Patient had a routine postpartum course. Patient is discharged home 11/26/16.  Physical exam  Vitals:   11/24/16 1747 11/24/16 1941 11/25/16 0545 11/26/16 0500  BP: (!) 121/57 119/71 (!) 92/59 118/70  Pulse: 72 71 60 72  Resp: Temp: 98.1 F (36.7 C) 97.8 F (36.6 C) 98.1 F (36.7 C) 98.3 F (36.8 C)  TempSrc: Oral Oral  Oral  SpO2: 100% 100%    Weight:      Height:       General: alert, cooperative and no distress Lochia: appropriate Uterine Fundus: firm Incision: N/A DVT Evaluation: No evidence of DVT seen on physical exam. No cords or calf tenderness. No significant  calf/ankle edema. Labs: Lab Results  Component Value Date   WBC 11.2 (H) 11/23/2016   HGB 12.6 11/23/2016   HCT 36.2 11/23/2016   MCV 88.7 11/23/2016   PLT 209 11/23/2016   CMP Latest Ref Rng & Units 05/05/2016  Glucose 65 - 99 mg/dL 85  BUN 6 - 20 mg/dL 8  Creatinine 8.46 - 9.62 mg/dL 9.52  Sodium 841 - 324 mmol/L 136  Potassium 3.5 - 5.1 mmol/L 4.1  Chloride 101 - 111 mmol/L 103  CO2 22 - 32 mmol/L 23  Calcium 8.9 - 10.3 mg/dL 9.8  Total Protein 6.5 - 8.1 g/dL 7.6  Total Bilirubin 0.3 - 1.2 mg/dL 0.7  Alkaline Phos 38 - 126 U/L 51  AST 15 - 41 U/L 24  ALT 14 - 54 U/L 23    Discharge instruction: per After Visit Summary and "Baby and Me Booklet".  After  visit meds:  Allergies as of 11/26/2016      Reactions   Shellfish Allergy Itching, Nausea And Vomiting      Medication List    STOP taking these medications   diphenhydramine-acetaminophen 25-500 MG Tabs tablet Commonly known as:  TYLENOL PM     TAKE these medications   ibuprofen 600 MG tablet Commonly known as:  ADVIL,MOTRIN Take 1 tablet (600 mg total) by mouth every 6 (six) hours.   VITAFOL GUMMIES 3.33-0.333-34.8 MG Chew Chew 3 tablets by mouth at bedtime.       Diet: routine diet  Activity: Advance as tolerated. Pelvic rest for 6 weeks.   Outpatient follow up:4 weeks Follow up Appt:No future appointments. Follow up Visit:No Follow-up on file.  Postpartum contraception: Combination OCPs  Newborn Data: Live born female  Birth Weight: 7 lb 9.6 oz (3447 g) APGAR: 8, 9  Baby Feeding: Bottle and Breast Disposition:home with mother   11/26/2016 Roe Coombs, CNM

## 2016-11-27 ENCOUNTER — Ambulatory Visit: Payer: Self-pay

## 2016-11-27 NOTE — Lactation Note (Signed)
This note was copied from a baby's chart. Lactation Consultation Note  Patient Name: Melanie Serrano AVWUJ'W Date: 11/27/2016 Reason for consult: Follow-up assessment Mom reports having some difficulty with latch. Reports baby becomes sleepy at breast. She is supplementing with EBM/formula with feedings. Mom's breasts are starting to fill but not engorged. Some edema noted at base of nipples, more on right than left. Left nipple compressible, LC assisted Mom with positioning, supporting breast and using breast compression to help baby latch. Baby initially shallow but with suckling is able to obtain more depth. Good suckling bursts observed with some swallows noted. Baby responds to breast massage when gets sleepy at breast. Encouraged to apply EBM/coconut oil prn to sore nipples.  Plan for d/c home: Continue to BF with feeding ques, at least 8-12 time in 24 hours. Pre-pump to help soften nipple/aerola for baby to latch. Try to keep baby nursing for 15-20 minutes both breasts most feedings. Post pump for 15 minutes during the day/evening to have EBM to supplement till Mom feels baby is latching well consistently and not falling asleep at breast and her milk comes to volume. BF only at night if baby satisfied at breast. Stressed importance of Mom BF with each feeding both breasts before offering supplement to encourage milk production, prevent engorgement and protect milk supply. Follow supplemental guidelines per hours of age. Mom plans to use bottle encouraged Dr. Manson Passey #1. When Milk comes to volume if baby satisfied at breast and breast softening with nursing then stop pumping/supplementing. Engorgement care reviewed if needed. OP f/u scheduled for Tuesday, 12/01/16 at 10:00 Peds f/u tomorrow.   Maternal Data    Feeding Feeding Type: Breast Fed Length of feed: 15 min  LATCH Score/Interventions Latch: Grasps breast easily, tongue down, lips flanged, rhythmical sucking. Intervention(s):  Adjust position;Assist with latch;Breast massage;Breast compression  Audible Swallowing: A few with stimulation  Type of Nipple: Everted at rest and after stimulation (short shaft, aerola edema at base of nipple)  Comfort (Breast/Nipple): Filling, red/small blisters or bruises, mild/mod discomfort  Problem noted: Filling;Mild/Moderate discomfort Interventions (Filling): Massage Interventions (Mild/moderate discomfort): Hand massage;Hand expression (EBM to sore nipples, no breakdown noted)  Hold (Positioning): Assistance needed to correctly position infant at breast and maintain latch. Intervention(s): Breastfeeding basics reviewed;Support Pillows;Position options;Skin to skin  LATCH Score: 7  Lactation Tools Discussed/Used Tools: Pump Breast pump type: Double-Electric Breast Pump   Consult Status Consult Status: Complete Date: 11/27/16 Follow-up type: In-patient    Alfred Levins 11/27/2016, 11:41 AM

## 2016-12-01 ENCOUNTER — Ambulatory Visit: Payer: Self-pay

## 2016-12-01 NOTE — Lactation Note (Signed)
This note was copied from a baby's chart. Lactation Consult  Mother's reason for visit:  Feeding assessment and assist with latch Visit Type:  Outpatient Appointment Notes:  Offering breast with each feeding, sleepy at breast, supplementing with feedings.  Consult:  Initial Lactation Consultant:  Melanie Serrano  ________________________________________________________________________  Melanie Serrano Name:  Melanie Serrano Date of Birth:  11/24/2016 Pediatrician:  Dario Guardian Gender:  female Gestational Age: [redacted]w[redacted]d (At Birth) Birth Weight:  7 lb 9.6 oz (3447 g) Weight at Discharge:   3240 g                       Date of Discharge:  11/27/16 There were no vitals filed for this visit. Last weight taken from location outside of [facility]:  11/28/16  7 lb. 8.0 oz     Location:Pediatrician's office Weight today:  7 lb. 13.1 oz/3546 gm with clean diaper, Baby now 62 days old  ________________________________________________________________________  Mother's Name: Melanie Serrano Type of delivery:  Vaginal, Spontaneous Delivery Breastfeeding Experience:  P2 - BF 1st child for 7 months Maternal Medical Conditions:  Depression/Anxiety Maternal Medications:  PNV  ________________________________________________________________________  Breastfeeding History (Post Discharge)  Frequency of breastfeeding:  Every 2-3 hours for 15-45 minutes. Mom is offering both breasts most feedings. Mom reports baby sleepy at night and she has difficulty keeping baby awake to BF. Mom is pumping every 2-3 hours receiving 30-60 ml. Mom has Spectra II DEBP for home Mom is supplementing with formula/EBM approx 8 times/day 30 ml each feeding via bottle  Infant Intake and Output Assessment  Voids:  6+ in 24 hrs.  Color:  Clear yellow Stools:  6 in 24 hrs.  Color:  Yellow  ________________________________________________________________________  Maternal Breast Assessment  Breast:  Full Nipple:  Erect Pain level:   0 _______________________________________________________________________ Feeding Assessment/Evaluation  Initial feeding assessment:  Infant's oral assessment:  WNL  Positioning:  Cross cradle Left breast  LATCH documentation:  Latch:  2 = Grasps breast easily, tongue down, lips flanged, rhythmical sucking.  Audible swallowing:  2 = Spontaneous and intermittent  Type of nipple:  2 = Everted at rest and after stimulation  Comfort (Breast/Nipple):  2 = Soft / non-tender  Hold (Positioning):  1 = Assistance needed to correctly position infant at breast and maintain latch  LATCH score:  9 Assisted Mom with positioning for baby to obtain/sustain good depth. Mom has not been supporting her breast with feeding. LC stressed importance of supporting her heavy breast for baby to sustain good depth. Some dimpling noted with initial latch but this improved during the feeding. PS=0, nipple round when baby came off the breast. Good swallows noted. Demonstrated to Mom how to un-tuck the lower lip.  Pre-feed weight:  3546 g  (7 lb. 13.1 oz.) Post-feed weight:  3590 g (7 lb. 14.7 oz.) Amount transferred:  44 ml with nursing for 15 minutes  Changed baby's diaper due to stool prior to latching to right breast. Assisted Mom with latching baby in football hold, Mom supporting breast, demonstrated how to un-tuck lower lip.  Pre-feed weight: 3556 g (7 lb. 13.4 oz) Post-feed weight:  3574 g (7 lb 14.1 oz) Amount transferred:  18 ml with nursing for 15 minutes  Total amount transferred:  52 ml  Advised Mom to continue to BF with feeding ques, 8-12 times in 24 hours. Anytime baby wants to go to breast. Basic teaching reviewed.  Mom can stop pumping and supplementing with feedings as  long as baby going to breast as discussed, breast softening with feedings, baby is satisfied with feedings at breast and having good output. Mom concerned baby sleepy at night. Advised Mom baby should feed at least 1 time at  night for now. This will help Mom not to become engorged, support Mom's milk supply and baby's weight.  Discussed pump/bottle 1 time at night if this is easier and takes less time for Mom. If baby feeding well during the day and having good output, let baby sleep 4 hours between feedings during the night.  Encouraged to alternate positions with feedings to empty breast well. Encouraged to come to support group. Call for further questions/concerns.

## 2016-12-07 ENCOUNTER — Telehealth: Payer: Self-pay | Admitting: *Deleted

## 2016-12-07 NOTE — Telephone Encounter (Signed)
Pt called to office stating she has delivered and now thinks she may have yeast/BV. States she has a cyst as well.  Attempt to contact pt. LM on VM to call office in order to get further information.

## 2016-12-08 NOTE — Telephone Encounter (Signed)
Needs appointment, should not be having these issues so close to delivery.  Thank you. R.Keylin Podolsky CNM

## 2016-12-08 NOTE — Telephone Encounter (Signed)
Pt states that she had a delivery 2 weeks ago. Pt now states that she is having some vaginal itching and foul fishy odor. Pt would like to know if she can get treatment for BV and yeast.  Pt states she also has a cyst like area,?hair follicle. Pt advised if this does not resolve she may need appt.  Pt made aware request sent to provider for approval.  Please advise and send Rx's if approved.

## 2016-12-09 NOTE — Telephone Encounter (Signed)
Please call pt to schedule appt with provider regarding problem per R.Denney request.

## 2016-12-29 ENCOUNTER — Telehealth (HOSPITAL_COMMUNITY): Payer: Self-pay | Admitting: Lactation Services

## 2016-12-30 ENCOUNTER — Other Ambulatory Visit (HOSPITAL_COMMUNITY)
Admission: RE | Admit: 2016-12-30 | Discharge: 2016-12-30 | Disposition: A | Discharge status: Home / Self Care | Payer: 59 | Source: Ambulatory Visit | Attending: Certified Nurse Midwife | Admitting: Certified Nurse Midwife

## 2016-12-30 ENCOUNTER — Encounter: Payer: Self-pay | Admitting: Certified Nurse Midwife

## 2016-12-30 ENCOUNTER — Ambulatory Visit (INDEPENDENT_AMBULATORY_CARE_PROVIDER_SITE_OTHER): Payer: 59 | Admitting: Certified Nurse Midwife

## 2016-12-30 VITALS — BP 114/80 | HR 101 | Wt 163.0 lb

## 2016-12-30 DIAGNOSIS — Z30011 Encounter for initial prescription of contraceptive pills: Secondary | ICD-10-CM

## 2016-12-30 DIAGNOSIS — B9689 Other specified bacterial agents as the cause of diseases classified elsewhere: Secondary | ICD-10-CM | POA: Diagnosis not present

## 2016-12-30 DIAGNOSIS — N898 Other specified noninflammatory disorders of vagina: Secondary | ICD-10-CM | POA: Diagnosis not present

## 2016-12-30 MED ORDER — NORETHINDRONE 0.35 MG PO TABS: 1.0000 | ORAL_TABLET | Freq: Every day | ORAL | 11 refills | Status: DC

## 2016-12-30 NOTE — Progress Notes (Signed)
Subjective:     Melanie Serrano is a 26 y.o. female who presents for a postpartum visit. She is 5 weeks postpartum following a spontaneous vaginal delivery. I have fully reviewed the prenatal and intrapartum course. The delivery was at 41.4 gestational weeks. Outcome: spontaneous vaginal delivery. Anesthesia: epidural. Postpartum course has been uncomplicated. Baby's course has been uncomplicated.Pecola Leisure. Baby is feeding by breast. Bleeding thin lochia. Bowel function is normal. Bladder function is . Patient is not sexually active. Contraception method is abstinence. Postpartum depression screening: negative. 0. Patient complain of  Vaginal "fish odor". Denies itchiness or irritation. Tobacco, alcohol and substance abuse history reviewed.  Adult immunizations reviewed including TDAP, rubella and varicella.   The following portions of the patient's history were reviewed and updated as appropriate: problem list.  Review of Systems Pertinent items noted in HPI and remainder of comprehensive ROS otherwise negative.   Objective:    BP 114/80   Pulse (!) 101   Wt 163 lb (73.9 kg)   BMI 28.87 kg/m   General:  alert, cooperative and appears stated age   Breasts:  inspection negative, no nipple discharge or bleeding, no masses or nodularity palpable  Lungs: clear to auscultation bilaterally  Heart:  regular rate and rhythm  Abdomen: soft, non-tender; bowel sounds normal; no masses,  no organomegaly   Vulva:  normal  Vagina: normal vagina,  Slight yellow-tinge discharge noted  Cervix:  Normal  corpus: normal size, contour, position, consistency, mobility, non-tender  Adnexa:  normal adnexa  Rectal Exam: Not performed.          50% of 30 min visit spent on counseling and coordination of care.   Assessment:   5 weeks postpartum exam. Pap smear not done at today's visit.  Due 06/04/17 Plan:  Encounter for initial prescription of contraceptive pills - Plan: norethindrone (MICRONOR,CAMILA,ERRIN) 0.35 MG  tablet  Vaginal discharge - Plan: Cervicovaginal ancillary only  Postpartum care and examination - Plan: Cervicovaginal ancillary only    1. Contraception: oral progesterone-only contraceptive  2.. Follow up in: 6 months for annual exam or as needed.   Preconception counseling provided Healthy lifestyle practices reviewed

## 2016-12-30 NOTE — Progress Notes (Signed)
Post Partum Exam  Melanie Serrano is a 26 y.o. [id number] female who presents for a postpartum visit. She is 5 weeks postpartum following a spontaneous vaginal delivery. I have fully reviewed the prenatal and intrapartum course. The delivery was at 41.4 gestational weeks.  Anesthesia: epidural. Postpartum course has been doing well. Baby's course has been doing well. Baby is feeding by breast. Bleeding staining only. Bowel function is normal. Bladder function is normal. Patient is not sexually active. Contraception method is abstinence. Would like to discuss BC options. Postpartum depression screening:neg, score 0.

## 2016-12-31 ENCOUNTER — Other Ambulatory Visit: Payer: Self-pay | Admitting: Certified Nurse Midwife

## 2016-12-31 ENCOUNTER — Ambulatory Visit: Payer: Self-pay

## 2016-12-31 DIAGNOSIS — N760 Acute vaginitis: Principal | ICD-10-CM

## 2016-12-31 DIAGNOSIS — B9689 Other specified bacterial agents as the cause of diseases classified elsewhere: Secondary | ICD-10-CM

## 2016-12-31 LAB — CERVICOVAGINAL ANCILLARY ONLY
BACTERIAL VAGINITIS: POSITIVE — AB
CANDIDA VAGINITIS: NEGATIVE
Chlamydia: NEGATIVE
NEISSERIA GONORRHEA: NEGATIVE
Trichomonas: NEGATIVE

## 2016-12-31 MED ORDER — METRONIDAZOLE 0.75 % VA GEL: 1.0000 | Freq: Two times a day (BID) | VAGINAL | 0 refills | Status: DC

## 2016-12-31 NOTE — Lactation Note (Signed)
This note was copied from a baby's chart. Lactation Consult   Mother's reason for visit:  Feeding Assessment, weight check Visit Type:  Outpatient Appointment Notes: 235 weeks old infant.  Mother is following up because baby has been coming off and on during feeding and at times seems frustrated at the breast. Consult:  Follow-Up Lactation Consultant:  Hardie PulleyBerkelhammer, Ruth Boschen  ________________________________________________________________________  _______________________________________________________________________  Mother's Name: Jacqualin Combesolton Grey Goertzen Type of delivery:  Vaginal, Spontaneous Delivery Breastfeeding Experience:  P2 Maternal Medications:  PNV  ________________________________________________________________________  Breastfeeding History (Post Discharge)  Frequency of breastfeeding:  q2-3 hours Duration of feeding:  5 min - 30 min    Pumping  Type of pump:  Medela pump in style Frequency:  2 times per day Volume:  120-180 ml  Infant Intake and Output Assessment  Voids:  8+ in 24 hrs.  Color:  Clear yellow Stools:  5-10  in 24 hrs.  Color:  Green and Yellow  ________________________________________________________________________  Maternal Breast Assessment  Breast:  Soft Nipple:  Erect Pain level:  0 Pain interventions:  Expressed breast milk  _______________________________________________________________________ Feeding Assessment/Evaluation  Initial feeding assessment:  Infant's oral assessment:  Variance  Positioning:  Cross cradle Left breast  LATCH documentation:  Latch:  2 = Grasps breast easily, tongue down, lips flanged, rhythmical sucking.  Audible swallowing:  1 = A few with stimulation  Type of nipple:  2 = Everted at rest and after stimulation  Comfort (Breast/Nipple):  2 = Soft / non-tender  Hold (Positioning):  2 = No assistance needed to correctly position infant at breast  LATCH score:  9  Attached assessment:   Shallow  Lips flanged:  No.  Lips untucked:  Yes.    Suck assessment:  Displays both  Tools:  Pump Instructed on use and cleaning of tool:  Yes.    Pre-feed weight:  5008 g  (11.07 lb.  Post-feed weight:  5044 g (11 lb. 2 oz.) Amount transferred:  36 ml Amount supplemented:  0 ml  Additional Feeding Assessment -   Infant's oral assessment:  Variance - short labial frenulum and some tightness with lingual frenulum mid posterior  Positioning:  Cross cradle Right breast  LATCH documentation:  Latch:  2 = Grasps breast easily, tongue down, lips flanged, rhythmical sucking.  Audible swallowing:  1 = A few with stimulation  Type of nipple:  2 = Everted at rest and after stimulation  Comfort (Breast/Nipple):  2 = Soft / non-tender  Hold (Positioning):  1 = Assistance needed to correctly position infant at breast and maintain latch  LATCH score:  8  Attached assessment:  Deep  Lips flanged:  No.  Lips untucked:  Yes.    Suck assessment:  Displays both  Pre-feed weight:  5044 g  (11 lb. 2 oz.) Post-feed weight:  5104 g (11 lb. 4.1 oz.) Amount transferred:  60 ml Amount supplemented:  0 ml  Total amount transferred:  96 ml Total supplement given:  0 ml  Oral assessment does indicate a short labial frenulum and also noted mid posterior lingual tightness possibly contributing to a weaker seal causing lots of noise and movement during feeding.  Baby does transfer an adequate amount of breastmilk when breastfeeding.  Mother also has strong let down.  Suggest she hand express off approx 1 oz prior to latching which helped baby sustain latch better.  Attempted in upright position with no change.  Recommend trying laid back position when home.  Side lying is  also helping baby sustain latch longer.  Praised mother for her efforts.

## 2018-02-20 ENCOUNTER — Emergency Department (INDEPENDENT_AMBULATORY_CARE_PROVIDER_SITE_OTHER): Payer: Medicaid Other

## 2018-02-20 ENCOUNTER — Other Ambulatory Visit: Payer: Self-pay

## 2018-02-20 ENCOUNTER — Encounter: Payer: Self-pay | Admitting: Emergency Medicine

## 2018-02-20 ENCOUNTER — Emergency Department (INDEPENDENT_AMBULATORY_CARE_PROVIDER_SITE_OTHER)
Admission: EM | Admit: 2018-02-20 | Discharge: 2018-02-20 | Disposition: A | Discharge status: Home / Self Care | Payer: Medicaid Other | Source: Home / Self Care | Attending: Family Medicine | Admitting: Family Medicine

## 2018-02-20 DIAGNOSIS — R062 Wheezing: Secondary | ICD-10-CM

## 2018-02-20 DIAGNOSIS — R05 Cough: Secondary | ICD-10-CM

## 2018-02-20 DIAGNOSIS — E860 Dehydration: Secondary | ICD-10-CM | POA: Diagnosis not present

## 2018-02-20 DIAGNOSIS — J209 Acute bronchitis, unspecified: Secondary | ICD-10-CM

## 2018-02-20 LAB — POCT CBC W AUTO DIFF (K'VILLE URGENT CARE)

## 2018-02-20 MED ORDER — AZITHROMYCIN 250 MG PO TABS: ORAL_TABLET | ORAL | 0 refills | Status: DC

## 2018-02-20 MED ORDER — PREDNISONE 20 MG PO TABS: ORAL_TABLET | ORAL | 0 refills | Status: DC

## 2018-02-20 MED ORDER — BENZONATATE 200 MG PO CAPS: ORAL_CAPSULE | ORAL | 0 refills | Status: DC

## 2018-02-20 NOTE — Discharge Instructions (Addendum)
Take plain guaifenesin (1200mg  extended release tabs such as Mucinex) twice daily, with plenty of water, for cough and congestion.  May add Pseudoephedrine (30mg , one or two every 4 to 6 hours) for sinus congestion.  Get adequate rest.   May take Delsym Cough Suppressant with Tessalon at bedtime for nighttime cough.  Try warm salt water gargles for sore throat.  Stop all antihistamines for now, and other non-prescription cough/cold preparations.

## 2018-02-20 NOTE — ED Triage Notes (Signed)
Patient was outdoors yesterday drinking alcohol; today she has sore throat from vomiting and her extremeties are tingling when she goes outside, and she is very tired. Has had panic attacks in past but these symptoms are not similar; she is under therapy but on no medications. She has only had sips of fluids today.

## 2018-02-20 NOTE — ED Provider Notes (Signed)
[facility]    CSN: [id number] Arrival date & time: 02/20/18  1231     History   Chief Complaint Chief Complaint  Patient presents with  . Numbness  . Sore Throat    HPI Melanie Serrano is a 27 y.o. female.   Patient reports that she was outside drinking alcohol yesterday, and had several episodes of vomiting, now resolved.  She states that her uvula has been swollen and painful when she swallows.  She states that she has had had increased chest congestion for about a month, and now has developed increased URI symptoms during the past several days. She denies history of asthma, but states that her child has asthma.  She smokes 1 ppd.  The history is provided by the patient.    Past Medical History:  Diagnosis Date  . Anxiety   . Depression     Patient Active Problem List   Diagnosis Date Noted  . Post-dates pregnancy 11/23/2016  . Positive GBS test 10/21/2016  . Tetrahydrocannabinol (THC) use disorder, mild, abuse 10/02/2016  . GERD (gastroesophageal reflux disease) 09/01/2016  . Supervision of other normal pregnancy, antepartum 06/04/2016  . MDD (major depressive disorder) 12/29/2015  . 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 WISDOM TOOTH EXTRACTION      OB History    Gravida  3   Para  2   Term  2   Preterm      AB  1   Living  2     SAB  1   TAB      Ectopic      Multiple  0   Live Births  2            Home Medications    Prior to Admission medications   Medication Sig Start Date End Date Taking? Authorizing Provider  etonogestrel-ethinyl estradiol (NUVARING) 0.12-0.015 MG/24HR vaginal ring Place 1 each vaginally every 28 (twenty-eight) days. Insert vaginally and leave in place for 3 consecutive weeks, then remove for 1 week.   Yes [provider]  azithromycin (ZITHROMAX Z-PAK) 250 MG tablet Take 2 tabs today;  then begin one tab once daily for 4 more days. 02/20/18   Lattie Haw, MD  benzonatate (TESSALON) 200 MG capsule Take one cap by mouth at bedtime as needed for cough.  May repeat in 4 to 6 hours 02/20/18   Lattie Haw, MD  ibuprofen (ADVIL,MOTRIN) 600 MG tablet Take 1 tablet (600 mg total) by mouth every 6 (six) hours. 11/26/16   Orvilla Cornwall A, CNM  metroNIDAZOLE (METROGEL VAGINAL) 0.75 % vaginal gel Place 1 Applicatorful vaginally 2 (two) times daily. 12/31/16   Orvilla Cornwall A, CNM  norethindrone (MICRONOR,CAMILA,ERRIN) 0.35 MG tablet Take 1 tablet (0.35 mg total) by mouth daily. 12/30/16   Orvilla Cornwall A, CNM  predniSONE (DELTASONE) 20 MG tablet Take one tab by mouth twice daily for 4 days, then one daily. Take with food. 02/20/18   Lattie Haw, MD  Prenatal Vit-Fe Phos-FA-Omega (VITAFOL GUMMIES) 3.33-0.333-34.8 MG CHEW Chew 3 tablets by mouth at bedtime. 10/02/16   Roe Coombs, CNM    Family History Family History  Problem Relation Age of Onset  . Cancer Father   . Hypertension Father   . Melanoma Father   . COPD Mother   . Hypertension Mother   . Heart attack Maternal Grandfather   . Cancer  Paternal Grandmother   . Diabetes Paternal Grandfather     Social History Social History   Tobacco Use  . Smoking status: Current Every Day Smoker    Packs/day: 0.50    Types: Cigarettes  . Smokeless tobacco: Current User  . Tobacco comment: 1/2 pack/day  Substance Use Topics  . Alcohol use: No  . Drug use: No    Types: Marijuana    Comment: nausea     Allergies   Shellfish allergy   Review of Systems Review of Systems ? sore throat + cough No pleuritic pain ? wheezing + nasal congestion + post-nasal drainage No sinus pain/pressure No itchy/red eyes No earache No hemoptysis No SOB No fever, + chills + nausea + vomiting, resolved No abdominal pain No diarrhea No urinary symptoms No skin rash + fatigue No myalgias No headache Used OTC  meds without relief   Physical Exam Triage Vital Signs ED Triage Vitals  Enc Vitals Group     BP 02/20/18 1347 114/78     Pulse Rate 02/20/18 1347 92     Resp 02/20/18 1347 16     Temp 02/20/18 1347 98.7 F (37.1 C)     Temp Source 02/20/18 1347 Oral     SpO2 02/20/18 1347 98 %     Weight 02/20/18 1348 155 lb (70.3 kg)     Height 02/20/18 1348 5\' 3"  (1.6 m)     Head Circumference --      Peak Flow --      Pain Score 02/20/18 1348 2     Pain Loc --      Pain Edu? --      Excl. in [place]? --    No data found.  Updated Vital Signs BP 114/78 (BP Location: Right Arm)   Pulse 92   Temp 98.7 F (37.1 C) (Oral)   Resp 16   Ht 5\' 3"  (1.6 m)   Wt 155 lb (70.3 kg)   LMP 01/07/2018 (Exact Date)   SpO2 98%   BMI 27.46 kg/m   Visual Acuity Right Eye Distance:   Left Eye Distance:   Bilateral Distance:    Right Eye Near:   Left Eye Near:    Bilateral Near:     Physical Exam Nursing notes and Vital Signs reviewed. Appearance:  Patient appears stated age, and in no acute distress Eyes:  Pupils are equal, round, and reactive to light and accomodation.  Extraocular movement is intact.  Conjunctivae are not inflamed  Ears:  Canals normal.  Tympanic membranes normal.  Nose:  Mildly congested turbinates.  No sinus tenderness.   Pharynx:  Normal Neck:  Supple.  Tender tonsillar nodes bilaterally  Lungs:   Faint bilateral expiratory wheezes.  Breath sounds are equal.  Moving air well. Heart:  Regular rate and rhythm without murmurs, rubs, or gallops.  Abdomen:  Nontender without masses or hepatosplenomegaly.  Bowel sounds are present.  No CVA or flank tenderness.  Extremities:  No edema.  Skin:  No rash present.    UC Treatments / Results  Labs (all labs ordered are listed, but only abnormal results are displayed) Labs Reviewed  POCT CBC W AUTO DIFF ([facility]):  WBC 9.5; LY 26.5; MO 3.5; GR 70.0; Hgb 14.5; Platelets 210     EKG None  Radiology Dg Chest 2  View  Result Date: 02/20/2018 CLINICAL DATA:  Cough and wheezing.  Dehydration. EXAM: CHEST - 2 VIEW COMPARISON:  None. FINDINGS: Lungs are clear. Heart  size and pulmonary vascularity are normal. No adenopathy. No bone lesions. IMPRESSION: No edema or consolidation. Electronically Signed   By: Bretta Bang III M.D.   On: 02/20/2018 14:49    Procedures Procedures (including critical care time)  Medications Ordered in UC Medications - No data to display  Initial Impression / Assessment and Plan / UC Course  I have reviewed the triage vital signs and the nursing notes.  Pertinent labs & imaging results that were available during my care of the patient were reviewed by me and considered in my medical decision making (see chart for details).    Begin Z-pak for atypical coverage and prednisone burst/taper. Prescription written for Benzonatate [facility]) to take at bedtime for night-time cough.  Followup with Family Doctor if not improved in about 7 to 10 days. Final Clinical Impressions(s) / UC Diagnoses   Final diagnoses:  Bronchospasm with bronchitis, acute     Discharge Instructions     Take plain guaifenesin (1200mg  extended release tabs such as Mucinex) twice daily, with plenty of water, for cough and congestion.  May add Pseudoephedrine (30mg , one or two every 4 to 6 hours) for sinus congestion.  Get adequate rest.   May take Delsym Cough Suppressant with Tessalon at bedtime for nighttime cough.  Try warm salt water gargles for sore throat.  Stop all antihistamines for now, and other non-prescription cough/cold preparations.        ED Prescriptions    Medication Sig Dispense Auth. Provider   azithromycin (ZITHROMAX Z-PAK) 250 MG tablet Take 2 tabs today; then begin one tab once daily for 4 more days. 6 tablet Lattie Haw, MD   predniSONE (DELTASONE) 20 MG tablet Take one tab by mouth twice daily for 4 days, then one daily. Take with food. 12 tablet Lattie Haw,  MD   benzonatate (TESSALON) 200 MG capsule Take one cap by mouth at bedtime as needed for cough.  May repeat in 4 to 6 hours 15 capsule Cathren Harsh Tera Mater, MD        Lattie Haw, MD 02/22/18 [phone]

## 2019-05-23 ENCOUNTER — Other Ambulatory Visit: Payer: Self-pay | Admitting: Registered"

## 2019-05-23 DIAGNOSIS — Z20822 Contact with and (suspected) exposure to covid-19: Secondary | ICD-10-CM

## 2019-05-24 LAB — NOVEL CORONAVIRUS, NAA: SARS-CoV-2, NAA: NOT DETECTED

## 2019-08-01 ENCOUNTER — Other Ambulatory Visit: Payer: Self-pay

## 2019-08-01 ENCOUNTER — Emergency Department (INDEPENDENT_AMBULATORY_CARE_PROVIDER_SITE_OTHER)
Admission: EM | Admit: 2019-08-01 | Discharge: 2019-08-01 | Disposition: A | Discharge status: Home / Self Care | Payer: Medicaid Other | Source: Home / Self Care | Attending: Family Medicine | Admitting: Family Medicine

## 2019-08-01 ENCOUNTER — Other Ambulatory Visit (HOSPITAL_COMMUNITY)
Admission: RE | Admit: 2019-08-01 | Discharge: 2019-08-01 | Disposition: A | Discharge status: Home / Self Care | Payer: Medicaid Other | Source: Ambulatory Visit | Attending: Family Medicine | Admitting: Family Medicine

## 2019-08-01 DIAGNOSIS — R300 Dysuria: Secondary | ICD-10-CM

## 2019-08-01 DIAGNOSIS — N898 Other specified noninflammatory disorders of vagina: Secondary | ICD-10-CM

## 2019-08-01 LAB — POCT URINALYSIS DIP (MANUAL ENTRY)
Bilirubin, UA: NEGATIVE
Blood, UA: NEGATIVE
Glucose, UA: 100 mg/dL — AB
Ketones, POC UA: NEGATIVE mg/dL
Nitrite, UA: POSITIVE — AB
Protein Ur, POC: NEGATIVE mg/dL
Spec Grav, UA: 1.02 (ref 1.010–1.025)
Urobilinogen, UA: 1 E.U./dL
pH, UA: 5.5 (ref 5.0–8.0)

## 2019-08-01 MED ORDER — NITROFURANTOIN MONOHYD MACRO 100 MG PO CAPS: 100.0000 mg | ORAL_CAPSULE | Freq: Two times a day (BID) | ORAL | 0 refills | Status: DC

## 2019-08-01 MED ORDER — FLUCONAZOLE 150 MG PO TABS: ORAL_TABLET | ORAL | 0 refills | Status: DC

## 2019-08-01 NOTE — ED Triage Notes (Signed)
Had BV about 2 months ago, then ended up with a UTI.  Has had a yeast infection. Pt is having discharge and vaginal pain

## 2019-08-01 NOTE — Discharge Instructions (Addendum)
Increase fluid intake. May use non-prescription AZO for about two days, if desired, to decrease urinary discomfort.  If symptoms become significantly worse during the night or over the weekend, proceed to the local emergency room.  

## 2019-08-01 NOTE — ED Provider Notes (Signed)
[facility]    CSN: [id number] Arrival date & time: 08/01/19  1440      History   Chief Complaint Chief Complaint  Patient presents with  . Vaginal Discharge    HPI Melanie Serrano is a 28 y.o. female.   Patient was treated for a UTI about 2 months ago with resolution of her symptoms.  She recently developed a pilonidal cyst, for which she finished Bactrim DS last week.  She has now developed a vaginal discharge which she suspects is caused by candida.  She is having mild dysuria.  She has no STD concerns.  Patient's last menstrual period was 07/25/2019.   The history is provided by the patient.    Past Medical History:  Diagnosis Date  . Anxiety   . Depression     Patient Active Problem List   Diagnosis Date Noted  . Post-dates pregnancy 11/23/2016  . Positive GBS test 10/21/2016  . Tetrahydrocannabinol (THC) use disorder, mild, abuse 10/02/2016  . GERD (gastroesophageal reflux disease) 09/01/2016  . Supervision of other normal pregnancy, antepartum 06/04/2016  . MDD (major depressive disorder) 12/29/2015  . 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 WISDOM TOOTH EXTRACTION      OB History    Gravida  3   Para  2   Term  2   Preterm      AB  1   Living  2     SAB  1   TAB      Ectopic      Multiple  0   Live Births  2            Home Medications    Prior to Admission medications   Medication Sig Start Date End Date Taking? Authorizing Provider  escitalopram (LEXAPRO) 20 MG tablet Take by mouth. 03/30/19  Yes [provider]  azithromycin (ZITHROMAX Z-PAK) 250 MG tablet Take 2 tabs today; then begin one tab once daily for 4 more days. 02/20/18   Lattie HawBeese, Erinn Huskins A, MD  benzonatate (TESSALON) 200 MG capsule Take one cap by mouth at bedtime as needed for cough.  May repeat in 4 to 6 hours 02/20/18   Lattie HawBeese, Inice Sanluis A, MD    etonogestrel-ethinyl estradiol (NUVARING) 0.12-0.015 MG/24HR vaginal ring Place 1 each vaginally every 28 (twenty-eight) days. Insert vaginally and leave in place for 3 consecutive weeks, then remove for 1 week.    [provider]  fluconazole (DIFLUCAN) 150 MG tablet Take one tab by mouth once, Repeat in 72 hours. 08/01/19   Lattie HawBeese, Memori Sammon A, MD  ibuprofen (ADVIL,MOTRIN) 600 MG tablet Take 1 tablet (600 mg total) by mouth every 6 (six) hours. 11/26/16   Orvilla Cornwallenney, Rachelle A, CNM  metroNIDAZOLE (METROGEL VAGINAL) 0.75 % vaginal gel Place 1 Applicatorful vaginally 2 (two) times daily. 12/31/16   Orvilla Cornwallenney, Rachelle A, CNM  nitrofurantoin, macrocrystal-monohydrate, (MACROBID) 100 MG capsule Take 1 capsule (100 mg total) by mouth 2 (two) times daily. Take with food. 08/01/19   Lattie HawBeese, Maryagnes Carrasco A, MD  norethindrone (MICRONOR,CAMILA,ERRIN) 0.35 MG tablet Take 1 tablet (0.35 mg total) by mouth daily. 12/30/16   Orvilla Cornwallenney, Rachelle A, CNM  predniSONE (DELTASONE) 20 MG tablet Take one tab by mouth twice daily for 4 days, then one daily. Take with food. 02/20/18   Lattie HawBeese, Azarel Banner A, MD  Prenatal Vit-Fe Phos-FA-Omega (VITAFOL GUMMIES) 3.33-0.333-34.8 MG CHEW Chew 3 tablets by mouth at  bedtime. 10/02/16   Morene Crocker, CNM    Family History Family History  Problem Relation Age of Onset  . Cancer Father   . Hypertension Father   . Melanoma Father   . COPD Mother   . Hypertension Mother   . Heart attack Maternal Grandfather   . Cancer Paternal Grandmother   . Diabetes Paternal Grandfather     Social History Social History   Tobacco Use  . Smoking status: Current Every Day Smoker    Packs/day: 0.50    Types: Cigarettes  . Smokeless tobacco: Current User  . Tobacco comment: 1/2 pack/day  Substance Use Topics  . Alcohol use: No  . Drug use: Yes    Types: Marijuana    Comment: nausea     Allergies   Shellfish allergy   Review of Systems Review of Systems  Constitutional: Negative for  activity change, appetite change, chills, diaphoresis, fatigue and fever.  HENT: Negative.   Eyes: Negative.   Respiratory: Negative.   Cardiovascular: Negative.   Gastrointestinal: Negative.   Genitourinary: Positive for dysuria, frequency, urgency and vaginal discharge. Negative for flank pain, genital sores, hematuria, pelvic pain, vaginal bleeding and vaginal pain.  Skin: Negative for rash.     Physical Exam Triage Vital Signs ED Triage Vitals  Enc Vitals Group     BP 08/01/19 1552 125/79     Pulse Rate 08/01/19 1552 98     Resp 08/01/19 1552 20     Temp 08/01/19 1552 97.9 F (36.6 C)     Temp Source 08/01/19 1552 Oral     SpO2 08/01/19 1552 98 %     Weight 08/01/19 1553 182 lb (82.6 kg)     Height 08/01/19 1553 5\' 3"  (1.6 m)     Head Circumference --      Peak Flow --      Pain Score 08/01/19 1552 5     Pain Loc --      Pain Edu? --      Excl. in [place]? --    No data found.  Updated Vital Signs BP 125/79 (BP Location: Right Arm)   Pulse 98   Temp 97.9 F (36.6 C) (Oral)   Resp 20   Ht 5\' 3"  (1.6 m)   Wt 82.6 kg   LMP 07/25/2019   SpO2 98%   BMI 32.24 kg/m   Visual Acuity Right Eye Distance:   Left Eye Distance:   Bilateral Distance:    Right Eye Near:   Left Eye Near:    Bilateral Near:     Physical Exam Nursing notes and Vital Signs reviewed. Appearance:  Patient appears stated age, and in no acute distress.    Eyes:  Pupils are equal, round, and reactive to light and accomodation.  Extraocular movement is intact.  Conjunctivae are not inflamed   Pharynx:  Normal; moist mucous membranes  Neck:  Supple.  No adenopathy Lungs:  Clear to auscultation.  Breath sounds are equal.  Moving air well. Heart:  Regular rate and rhythm without murmurs, rubs, or gallops.  Abdomen:  Nontender without masses or hepatosplenomegaly.  Bowel sounds are present.  No CVA or flank tenderness.  Extremities:  No edema.  Skin:  No rash present.    Pelvic exam deferred;  patient instructed in self-obtaining vaginal specimen  UC Treatments / Results  Labs (all labs ordered are listed, but only abnormal results are displayed) Labs Reviewed  POCT URINALYSIS DIP (MANUAL ENTRY) - Abnormal; Notable  for the following components:      Result Value   Color, UA orange (*)    Clarity, UA cloudy (*)    Glucose, UA =100 (*)    Nitrite, UA Positive (*)    Leukocytes, UA Trace (*)    All other components within normal limits  URINE CULTURE  URINE CULTURE  GC/CHLAMYDIA PROBE AMP ([place]) NOT AT [facility]    EKG   Radiology No results found.  Procedures Procedures (including critical care time)  Medications Ordered in UC Medications - No data to display  Initial Impression / Assessment and Plan / UC Course  I have reviewed the triage vital signs and the nursing notes.  Pertinent labs & imaging results that were available during my care of the patient were reviewed by me and considered in my medical decision making (see chart for details).    ?cystitis. Begin empiric Macrobid.  Rx for Diflucan (two doses). Urine culture pending.  GC/chlamydia pending. Followup with Family Doctor if not improved in 7 to 10 days   Final Clinical Impressions(s) / UC Diagnoses   Final diagnoses:  Dysuria  Vaginal discharge     Discharge Instructions     Increase fluid intake. May use non-prescription AZO for about two days, if desired, to decrease urinary discomfort.   If symptoms become significantly worse during the night or over the weekend, proceed to the local emergency room.     ED Prescriptions    Medication Sig Dispense Auth. Provider   nitrofurantoin, macrocrystal-monohydrate, (MACROBID) 100 MG capsule Take 1 capsule (100 mg total) by mouth 2 (two) times daily. Take with food. 14 capsule Lattie Haw, MD   fluconazole (DIFLUCAN) 150 MG tablet Take one tab by mouth once, Repeat in 72 hours. 2 tablet Lattie Haw, MD        Lattie Haw,  MD 08/03/19 [phone]

## 2019-08-03 LAB — URINE CULTURE
MICRO NUMBER:: 1237333
SPECIMEN QUALITY:: ADEQUATE

## 2019-08-03 LAB — GC/CHLAMYDIA PROBE AMP (~~LOC~~) NOT AT ARMC
Chlamydia: NEGATIVE
Neisseria Gonorrhea: NEGATIVE

## 2019-10-23 ENCOUNTER — Emergency Department (INDEPENDENT_AMBULATORY_CARE_PROVIDER_SITE_OTHER)
Admission: EM | Admit: 2019-10-23 | Discharge: 2019-10-23 | Disposition: A | Discharge status: Home / Self Care | Payer: Medicaid Other | Source: Home / Self Care | Attending: Family Medicine | Admitting: Family Medicine

## 2019-10-23 ENCOUNTER — Other Ambulatory Visit: Payer: Self-pay

## 2019-10-23 ENCOUNTER — Emergency Department (INDEPENDENT_AMBULATORY_CARE_PROVIDER_SITE_OTHER): Payer: Medicaid Other

## 2019-10-23 DIAGNOSIS — R053 Chronic cough: Secondary | ICD-10-CM

## 2019-10-23 DIAGNOSIS — R0602 Shortness of breath: Secondary | ICD-10-CM

## 2019-10-23 DIAGNOSIS — R5383 Other fatigue: Secondary | ICD-10-CM

## 2019-10-23 DIAGNOSIS — F17200 Nicotine dependence, unspecified, uncomplicated: Secondary | ICD-10-CM

## 2019-10-23 DIAGNOSIS — R05 Cough: Secondary | ICD-10-CM | POA: Diagnosis not present

## 2019-10-23 DIAGNOSIS — J069 Acute upper respiratory infection, unspecified: Secondary | ICD-10-CM

## 2019-10-23 LAB — POCT CBC W AUTO DIFF (K'VILLE URGENT CARE)

## 2019-10-23 LAB — POCT MONO SCREEN (KUC): Mono, POC: NEGATIVE

## 2019-10-23 MED ORDER — DOXYCYCLINE HYCLATE 100 MG PO CAPS: 100.0000 mg | ORAL_CAPSULE | Freq: Two times a day (BID) | ORAL | 0 refills | Status: DC

## 2019-10-23 MED ORDER — PREDNISONE 20 MG PO TABS: ORAL_TABLET | ORAL | 0 refills | Status: DC

## 2019-10-23 NOTE — Discharge Instructions (Addendum)
Take plain guaifenesin (1200mg  extended release tabs such as Mucinex) twice daily, with plenty of water, for cough and congestion.  May add Pseudoephedrine (30mg , one or two every 4 to 6 hours) for sinus congestion.  Get adequate rest.   May use Afrin nasal spray (or generic oxymetazoline) each morning for about 5 days and then discontinue.  Also recommend using saline nasal spray several times daily and saline nasal irrigation (AYR is a common brand).  Use Flonase nasal spray each morning after using Afrin nasal spray and saline nasal irrigation. Try warm salt water gargles for sore throat.  Use albuterol inhaler as needed Stop all antihistamines for now, and other non-prescription cough/cold preparations. May take Delsym Cough Suppressant at bedtime for nighttime cough.   Isolate yourself until COVID-19 test result is available.   If your COVID19 test is positive, then you are infected with the novel coronavirus and could give the virus to others.  Please continue isolation at home for at least 10 days since the start of your symptoms. Once you complete your 10 day quarantine, you may return to normal activities as long as you've not had a fever for over 24 hours (without taking fever reducing medicine) and your symptoms are improving. Please continue good preventive care measures, including:  frequent hand-washing, avoid touching your face, cover coughs/sneezes, stay out of crowds and keep a 6 foot distance from others.  Go to the nearest hospital emergency room if fever/cough/breathlessness are severe or illness seems like a threat to life.

## 2019-10-23 NOTE — ED Provider Notes (Signed)
[facility]    CSN: [id number] Arrival date & time: 10/23/19  1843      History   Chief Complaint Chief Complaint  Patient presents with  . Otalgia    HPI Melanie Serrano is a 29 y.o. female.   Four days ago patient developed a sore throat and bilateral neck and ear pain that have persisted. She has been fatigued with sweats at night and myalgias.  She has a chronic cough that has become worse during the past several days.  She has had occasional wheezing.   She denies chest tightness and changes in taste/smell. She continues to smoke.    The history is provided by the patient.    Past Medical History:  Diagnosis Date  . Anxiety   . Depression     Patient Active Problem List   Diagnosis Date Noted  . Post-dates pregnancy 11/23/2016  . Positive GBS test 10/21/2016  . Tetrahydrocannabinol (THC) use disorder, mild, abuse 10/02/2016  . GERD (gastroesophageal reflux disease) 09/01/2016  . Supervision of other normal pregnancy, antepartum 06/04/2016  . MDD (major depressive disorder) 12/29/2015  . MDD (major depressive disorder), recurrent severe, without psychosis ([place]) 12/29/2015    Past Surgical History:  Procedure Laterality Date  . WISDOM TOOTH EXTRACTION      OB History    Gravida  3   Para  2   Term  2   Preterm      AB  1   Living  2     SAB  1   TAB      Ectopic      Multiple  0   Live Births  2            Home Medications    Prior to Admission medications   Medication Sig Start Date End Date Taking? Authorizing Provider  escitalopram (LEXAPRO) 20 MG tablet Take by mouth. 03/30/19  Yes [provider]  etonogestrel-ethinyl estradiol (NUVARING) 0.12-0.015 MG/24HR vaginal ring Place 1 each vaginally every 28 (twenty-eight) days. Insert vaginally and leave in place for 3 consecutive weeks, then remove for 1 week.    [provider]  fluconazole (DIFLUCAN) 150 MG tablet Take one tab by mouth once, Repeat in  72 hours. 08/01/19   Kandra Nicolas, MD  ibuprofen (ADVIL,MOTRIN) 600 MG tablet Take 1 tablet (600 mg total) by mouth every 6 (six) hours. 11/26/16   Kandis Cocking A, CNM  metroNIDAZOLE (METROGEL VAGINAL) 0.75 % vaginal gel Place 1 Applicatorful vaginally 2 (two) times daily. 12/31/16   Kandis Cocking A, CNM  norethindrone (MICRONOR,CAMILA,ERRIN) 0.35 MG tablet Take 1 tablet (0.35 mg total) by mouth daily. 12/30/16   Kandis Cocking A, CNM  Prenatal Vit-Fe Phos-FA-Omega (VITAFOL GUMMIES) 3.33-0.333-34.8 MG CHEW Chew 3 tablets by mouth at bedtime. 10/02/16   Morene Crocker, CNM    Family History Family History  Problem Relation Age of Onset  . Cancer Father   . Hypertension Father   . Melanoma Father   . COPD Mother   . Hypertension Mother   . Heart attack Maternal Grandfather   . Cancer Paternal Grandmother   . Diabetes Paternal Grandfather     Social History Social History   Tobacco Use  . Smoking status: Current Every Day Smoker    Packs/day: 1.00    Types: Cigarettes  . Smokeless tobacco: Current User  Substance Use Topics  . Alcohol use: No  . Drug use: Yes    Types: Marijuana  Comment: nausea     Allergies   Shellfish allergy   Review of Systems Review of Systems + sore throat + cough No pleuritic pain + wheezing + nasal congestion + post-nasal drainage No sinus pain/pressure No itchy/red eyes ? earache No hemoptysis + SOB No fever, + chills/sweats + nausea No vomiting No abdominal pain No diarrhea No urinary symptoms No skin rash + fatigue + myalgias upper back and neck No headache Used OTC meds (Ibuprofen) without relief   Physical Exam Triage Vital Signs ED Triage Vitals  Enc Vitals Group     BP 10/23/19 1903 106/68     Pulse Rate 10/23/19 1903 84     Resp 10/23/19 1903 16     Temp 10/23/19 1903 98.8 F (37.1 C)     Temp Source 10/23/19 1903 Oral     SpO2 10/23/19 1903 100 %     Weight --      Height --      Head  Circumference --      Peak Flow --      Pain Score 10/23/19 1858 8     Pain Loc --      Pain Edu? --      Excl. in GC? --    No data found.  Updated Vital Signs BP 106/68 (BP Location: Right Arm)   Pulse 84   Temp 98.8 F (37.1 C) (Oral)   Resp 16   LMP 09/26/2019   SpO2 100%   Visual Acuity Right Eye Distance:   Left Eye Distance:   Bilateral Distance:    Right Eye Near:   Left Eye Near:    Bilateral Near:     Physical Exam Nursing notes and Vital Signs reviewed. Appearance:  Patient appears stated age, and in no acute distress Eyes:  Pupils are equal, round, and reactive to light and accomodation.  Extraocular movement is intact.  Conjunctivae are not inflamed  Ears:  Canals normal.  Tympanic membranes normal.  Nose:  Congested turbinates.  No sinus tenderness.  Pharynx:  Normal Neck:  Supple.  Mildly enlarged lateral nodes are present, tender to palpation on the left. Tender shotty bilateral posterior cervical nodes palpated.   Lungs:  Clear to auscultation.  Breath sounds are equal.  Moving air well. Heart:  Regular rate and rhythm without murmurs, rubs, or gallops.  Abdomen:  Nontender without masses or hepatosplenomegaly.  Bowel sounds are present.  No CVA or flank tenderness.  Extremities:  No edema.  Skin:  No rash present.   UC Treatments / Results  Labs (all labs ordered are listed, but only abnormal results are displayed) Labs Reviewed  NOVEL CORONAVIRUS, NAA  POCT CBC W AUTO DIFF ([facility]):  WBC 7.3; LY 31.5; MO 4.1; GR 64.4; Hgb 14.2; Platelets 212   POCT MONO SCREEN ([facility]) negative    EKG   Radiology DG Chest 2 View  Result Date: 10/23/2019 CLINICAL DATA:  Cough and wheezing.  Smoker. EXAM: CHEST - 2 VIEW COMPARISON:  02/20/2018 FINDINGS: The heart size and mediastinal contours are within normal limits. Both lungs are clear. The visualized skeletal structures are unremarkable. IMPRESSION: No active cardiopulmonary disease.  Electronically Signed   By: Signa Kell M.D.   On: 10/23/2019 20:12    Procedures Procedures (including critical care time)  Medications Ordered in UC Medications - No data to display  Initial Impression / Assessment and Plan / UC Course  I have reviewed the triage vital signs and the nursing  notes.  Pertinent labs & imaging results that were available during my care of the patient were reviewed by me and considered in my medical decision making (see chart for details).    Normal CBC, chest x-ray, and Monospot reassuring. Suspect viral URI. Because of patient's history of smoking and chronic cough, will begin doxycycline and prednisone burst/taper. COVID19 send out. Followup with Family Doctor if not improved in 10 days.   Final Clinical Impressions(s) / UC Diagnoses   Final diagnoses:  Fatigue, unspecified type  Chronic cough  Viral URI with cough     Discharge Instructions     Take plain guaifenesin (1200mg  extended release tabs such as Mucinex) twice daily, with plenty of water, for cough and congestion.  May add Pseudoephedrine (30mg , one or two every 4 to 6 hours) for sinus congestion.  Get adequate rest.   May use Afrin nasal spray (or generic oxymetazoline) each morning for about 5 days and then discontinue.  Also recommend using saline nasal spray several times daily and saline nasal irrigation (AYR is a common brand).  Use Flonase nasal spray each morning after using Afrin nasal spray and saline nasal irrigation. Try warm salt water gargles for sore throat.  Stop all antihistamines for now, and other non-prescription cough/cold preparations. May take Delsym Cough Suppressant at bedtime for nighttime cough.   Isolate yourself until COVID-19 test result is available.   If your COVID19 test is positive, then you are infected with the novel coronavirus and could give the virus to others.  Please continue isolation at home for at least 10 days since the start of your  symptoms. Once you complete your 10 day quarantine, you may return to normal activities as long as you've not had a fever for over 24 hours (without taking fever reducing medicine) and your symptoms are improving. Please continue good preventive care measures, including:  frequent hand-washing, avoid touching your face, cover coughs/sneezes, stay out of crowds and keep a 6 foot distance from others.  Go to the nearest hospital emergency room if fever/cough/breathlessness are severe or illness seems like a threat to life.    ED Prescriptions    None        , MD 10/24/19 [phone]

## 2019-10-23 NOTE — ED Triage Notes (Signed)
Patient presents to Urgent Care with complaints of bilateral ear pain and irriation since four days ago. Patient reports she feels like it goes down into her neck, has been taking benadryl to help her sleep.

## 2019-10-25 LAB — SARS-COV-2, NAA 2 DAY TAT

## 2019-10-25 LAB — NOVEL CORONAVIRUS, NAA: SARS-CoV-2, NAA: NOT DETECTED

## 2020-07-04 ENCOUNTER — Other Ambulatory Visit: Payer: Self-pay

## 2020-07-04 ENCOUNTER — Emergency Department (INDEPENDENT_AMBULATORY_CARE_PROVIDER_SITE_OTHER): Payer: Medicaid Other

## 2020-07-04 ENCOUNTER — Emergency Department
Admission: EM | Admit: 2020-07-04 | Discharge: 2020-07-04 | Disposition: A | Discharge status: Home / Self Care | Payer: Medicaid Other | Source: Home / Self Care

## 2020-07-04 DIAGNOSIS — T7491XA Unspecified adult maltreatment, confirmed, initial encounter: Secondary | ICD-10-CM

## 2020-07-04 DIAGNOSIS — S5012XA Contusion of left forearm, initial encounter: Secondary | ICD-10-CM

## 2020-07-04 DIAGNOSIS — S92411A Displaced fracture of proximal phalanx of right great toe, initial encounter for closed fracture: Secondary | ICD-10-CM

## 2020-07-04 DIAGNOSIS — R509 Fever, unspecified: Secondary | ICD-10-CM

## 2020-07-04 DIAGNOSIS — J069 Acute upper respiratory infection, unspecified: Secondary | ICD-10-CM
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
## 2020-07-04 NOTE — ED Notes (Signed)
Per Dario Guardian, PA, boot for R foot and crutches given to pt and instructed regarding use.
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
## 2020-07-04 NOTE — Discharge Instructions (Signed)
  Stay off your Right foot until followed up with a specialist for further evaluation and treatment. Surgery may be needed to help with healing of your big toe.   Call to establish care with a primary care provider for ongoing healthcare needs. Use resource guide as needed.

## 2020-07-06 LAB — COVID-19, FLU A+B AND RSV
Influenza A, NAA: NOT DETECTED
Influenza B, NAA: NOT DETECTED
RSV, NAA: NOT DETECTED
SARS-CoV-2, NAA: NOT DETECTED

## 2020-07-19 ENCOUNTER — Other Ambulatory Visit: Payer: Self-pay

## 2020-07-19 ENCOUNTER — Emergency Department
Admission: EM | Admit: 2020-07-19 | Discharge: 2020-07-19 | Disposition: A | Discharge status: Home / Self Care | Payer: Medicaid Other | Source: Home / Self Care

## 2020-07-19 ENCOUNTER — Emergency Department (INDEPENDENT_AMBULATORY_CARE_PROVIDER_SITE_OTHER): Payer: Medicaid Other

## 2020-07-19 DIAGNOSIS — R062 Wheezing: Secondary | ICD-10-CM

## 2020-07-19 DIAGNOSIS — R6889 Other general symptoms and signs: Secondary | ICD-10-CM

## 2020-07-19 DIAGNOSIS — R059 Cough, unspecified: Secondary | ICD-10-CM | POA: Diagnosis not present

## 2020-07-19 DIAGNOSIS — R509 Fever, unspecified: Secondary | ICD-10-CM

## 2020-07-19 DIAGNOSIS — J069 Acute upper respiratory infection, unspecified: Secondary | ICD-10-CM

## 2020-07-19 LAB — POC SARS CORONAVIRUS 2 AG -  ED: SARS Coronavirus 2 Ag: NEGATIVE

## 2020-07-19 MED ORDER — ALBUTEROL SULFATE HFA 108 (90 BASE) MCG/ACT IN AERS: 1.0000 | INHALATION_SPRAY | Freq: Four times a day (QID) | RESPIRATORY_TRACT | 0 refills | Status: AC | PRN

## 2020-07-19 MED ORDER — DOXYCYCLINE HYCLATE 100 MG PO CAPS: 100.0000 mg | ORAL_CAPSULE | Freq: Two times a day (BID) | ORAL | 0 refills | Status: AC

## 2020-07-19 NOTE — Discharge Instructions (Signed)
°  You may take 500mg  acetaminophen every 4-6 hours or in combination with ibuprofen 400-600mg  every 6-8 hours as needed for pain, inflammation, and fever.  Be sure to well hydrated with clear liquids and get at least 8 hours of sleep at night, preferably more while sick.   Please follow up with family medicine in 1 week if needed.  Please take antibiotics as prescribed and be sure to complete entire course even if you start to feel better to ensure infection does not come back.   Call 911 or have someone drive you to the hospital if symptoms significantly worsening.

## 2020-07-19 NOTE — ED Provider Notes (Signed)
[facility]    CSN: [id number] Arrival date & time: 07/19/20  1727      History   Chief Complaint Chief Complaint  Patient presents with  . Fever    HPI Melanie Serrano is a 29 y.o. female.   HPI  Melanie Serrano is a 29 y.o. female presenting to UC with c/o low grade fever for about 2 weeks, generalized body aches, mild congestion and cough.  She has taken tylenol and ibuprofen PRN.  She had a negative COVID and flu test same day as symptom onset. Denies n/v/d. No known sick contacts.   Past Medical History:  Diagnosis Date  . Anxiety   . Depression     Patient Active Problem List   Diagnosis Date Noted  . Post-dates pregnancy 11/23/2016  . Positive GBS test 10/21/2016  . Tetrahydrocannabinol (THC) use disorder, mild, abuse 10/02/2016  . GERD (gastroesophageal reflux disease) 09/01/2016  . Supervision of other normal pregnancy, antepartum 06/04/2016  . MDD (major depressive disorder) 12/29/2015  . 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 WISDOM TOOTH EXTRACTION      OB History    Gravida  3   Para  2   Term  2   Preterm      AB  1   Living  2     SAB  1   IAB      Ectopic      Multiple  0   Live Births  2            Home Medications    Prior to Admission medications   Medication Sig Start Date End Date Taking? Authorizing Provider  albuterol (VENTOLIN HFA) 108 (90 Base) MCG/ACT inhaler Inhale 1-2 puffs into the lungs every 6 (six) hours as needed for wheezing or shortness of breath. 07/19/20   Lurene Shadow, PA-C  doxycycline (VIBRAMYCIN) 100 MG capsule Take 1 capsule (100 mg total) by mouth 2 (two) times daily for 7 days. 07/19/20 07/26/20  Lurene Shadow, PA-C  escitalopram (LEXAPRO) 20 MG tablet Take by mouth. 03/30/19   [provider]  etonogestrel-ethinyl estradiol (NUVARING) 0.12-0.015 MG/24HR vaginal ring Place  1 each vaginally every 28 (twenty-eight) days. Insert vaginally and leave in place for 3 consecutive weeks, then remove for 1 week.    [provider]  fluconazole (DIFLUCAN) 150 MG tablet Take one tab by mouth once, Repeat in 72 hours. 08/01/19   Lattie Haw, MD  ibuprofen (ADVIL,MOTRIN) 600 MG tablet Take 1 tablet (600 mg total) by mouth every 6 (six) hours. 11/26/16   Orvilla Cornwall A, CNM  metroNIDAZOLE (METROGEL VAGINAL) 0.75 % vaginal gel Place 1 Applicatorful vaginally 2 (two) times daily. 12/31/16   Orvilla Cornwall A, CNM  norethindrone (MICRONOR,CAMILA,ERRIN) 0.35 MG tablet Take 1 tablet (0.35 mg total) by mouth daily. 12/30/16   Orvilla Cornwall A, CNM  predniSONE (DELTASONE) 20 MG tablet Take one tab by mouth twice daily for 4 days, then one daily. Take with food. 10/23/19   Lattie Haw, MD  Prenatal Vit-Fe Phos-FA-Omega (VITAFOL GUMMIES) 3.33-0.333-34.8 MG CHEW Chew 3 tablets by mouth at bedtime. 10/02/16   Roe Coombs, CNM    Family History Family History  Problem Relation Age of Onset  . Cancer Father   . Hypertension Father   . Melanoma Father   . COPD Mother   . Hypertension Mother   .  Heart attack Maternal Grandfather   . Cancer Paternal Grandmother   . Diabetes Paternal Grandfather     Social History Social History   Tobacco Use  . Smoking status: Current Every Day Smoker    Packs/day: 1.00    Types: Cigarettes  . Smokeless tobacco: Current User  Substance Use Topics  . Alcohol use: Yes    Comment: rarely  . Drug use: Yes    Frequency: 7.0 times per week    Types: Marijuana     Allergies   Shellfish allergy   Review of Systems Review of Systems  Constitutional: Positive for fatigue and fever (low-grade). Negative for chills.  HENT: Positive for congestion. Negative for sinus pressure, sinus pain and sore throat.   Respiratory: Positive for cough. Negative for shortness of breath.   Gastrointestinal: Negative for diarrhea, nausea  and vomiting.  Neurological: Negative for dizziness and headaches.     Physical Exam Triage Vital Signs ED Triage Vitals  Enc Vitals Group     BP 07/19/20 1817 102/67     Pulse Rate 07/19/20 1817 95     Resp 07/19/20 1817 16     Temp 07/19/20 1817 99.2 F (37.3 C)     Temp Source 07/19/20 1817 Oral     SpO2 07/19/20 1817 97 %     Weight --      Height --      Head Circumference --      Peak Flow --      Pain Score 07/19/20 1815 7     Pain Loc --      Pain Edu? --      Excl. in GC? --    No data found.  Updated Vital Signs BP 102/67 (BP Location: Right Arm)   Pulse 95   Temp 99.2 F (37.3 C) (Oral)   Resp 16   SpO2 97%   Visual Acuity Right Eye Distance:   Left Eye Distance:   Bilateral Distance:    Right Eye Near:   Left Eye Near:    Bilateral Near:     Physical Exam Vitals and nursing note reviewed.  Constitutional:      General: She is not in acute distress.    Appearance: Normal appearance. She is well-developed and well-nourished. She is not ill-appearing, toxic-appearing or diaphoretic.  HENT:     Head: Normocephalic and atraumatic.     Right Ear: Tympanic membrane and ear canal normal.     Left Ear: Tympanic membrane and ear canal normal.     Nose: Congestion present.     Right Sinus: No maxillary sinus tenderness or frontal sinus tenderness.     Left Sinus: No maxillary sinus tenderness or frontal sinus tenderness.     Mouth/Throat:     Lips: Pink.     Mouth: Mucous membranes are moist.     Pharynx: Oropharynx is clear. Uvula midline.  Eyes:     Extraocular Movements: EOM normal.  Cardiovascular:     Rate and Rhythm: Normal rate and regular rhythm.  Pulmonary:     Effort: Pulmonary effort is normal. No respiratory distress.     Breath sounds: No stridor. Wheezing ( upper lung fields) present. No rhonchi or rales.  Musculoskeletal:        General: Normal range of motion.     Cervical back: Normal range of motion and neck supple. No  tenderness.  Lymphadenopathy:     Cervical: No cervical adenopathy.  Skin:    General: Skin  is warm and dry.  Neurological:     Mental Status: She is alert and oriented to person, place, and time.  Psychiatric:        Mood and Affect: Mood and affect normal.        Behavior: Behavior normal.      UC Treatments / Results  Labs (all labs ordered are listed, but only abnormal results are displayed) Labs Reviewed  COVID-19, FLU A+B NAA  POC SARS CORONAVIRUS 2 AG -  ED    EKG   Radiology Narrative & Impression  CLINICAL DATA:  Cough and congestion  EXAM: CHEST - 2 VIEW  COMPARISON:  10/23/2019  FINDINGS: The heart size and mediastinal contours are within normal limits. Both lungs are clear. The visualized skeletal structures are unremarkable.  IMPRESSION: No active cardiopulmonary disease.   Electronically Signed   By: Jasmine Pang M.D.   On: 07/19/2020 19:31    Procedures Procedures (including critical care time)  Medications Ordered in UC Medications - No data to display  Initial Impression / Assessment and Plan / UC Course  I have reviewed the triage vital signs and the nursing notes.  Pertinent labs & imaging results that were available during my care of the patient were reviewed by me and considered in my medical decision making (see chart for details).     Wheeze noted on exam, O2 sat 97% on RA CXR: no acute findings Due to duration of symptoms, will cover for secondary bacterial infection Rx: doxycycline, albuterol F/u with PCP   Final Clinical Impressions(s) / UC Diagnoses   Final diagnoses:  Flu-like symptoms  Acute upper respiratory infection  Febrile illness  Wheeze     Discharge Instructions      You may take 500mg  acetaminophen every 4-6 hours or in combination with ibuprofen 400-600mg  every 6-8 hours as needed for pain, inflammation, and fever.  Be sure to well hydrated with clear liquids and get at least 8 hours of  sleep at night, preferably more while sick.   Please follow up with family medicine in 1 week if needed.  Please take antibiotics as prescribed and be sure to complete entire course even if you start to feel better to ensure infection does not come back.   Call 911 or have someone drive you to the hospital if symptoms significantly worsening.     ED Prescriptions    Medication Sig Dispense Auth. Provider   doxycycline (VIBRAMYCIN) 100 MG capsule Take 1 capsule (100 mg total) by mouth 2 (two) times daily for 7 days. 14 capsule Sahib Pella O, PA-C   albuterol (VENTOLIN HFA) 108 (90 Base) MCG/ACT inhaler Inhale 1-2 puffs into the lungs every 6 (six) hours as needed for wheezing or shortness of breath. 18 g , PA-C     PDMP not reviewed this encounter.   Lurene Shadow, Lurene Shadow 07/22/20 [phone]

## 2020-07-19 NOTE — ED Triage Notes (Signed)
Patient presents to Urgent Care with complaints of low grade fever since 2 weeks ago. Patient reports she also has generalized body aches as well. Takes tylenol and ibuprofen intermittently. Had recent negative covid and flu tests performed.

## 2020-07-23 LAB — COVID-19, FLU A+B NAA
Influenza A, NAA: DETECTED — AB
Influenza B, NAA: NOT DETECTED
SARS-CoV-2, NAA: NOT DETECTED

## 2021-04-22 ENCOUNTER — Emergency Department
Admission: EM | Admit: 2021-04-22 | Discharge: 2021-04-22 | Disposition: A | Discharge status: Home / Self Care | Payer: Medicaid Other | Source: Home / Self Care

## 2021-04-22 ENCOUNTER — Other Ambulatory Visit: Payer: Self-pay

## 2021-04-22 ENCOUNTER — Encounter: Payer: Self-pay | Admitting: Emergency Medicine

## 2021-04-22 DIAGNOSIS — R062 Wheezing: Secondary | ICD-10-CM

## 2021-04-22 DIAGNOSIS — U071 COVID-19: Secondary | ICD-10-CM | POA: Diagnosis not present

## 2021-04-22 MED ORDER — METHYLPREDNISOLONE 4 MG PO TBPK: ORAL_TABLET | ORAL | 0 refills | Status: DC

## 2021-04-22 NOTE — Discharge Instructions (Addendum)
Advised/instructed patient to take medication as directed with food to completion.  Encourage patient increase daily water intake while taking this medication.  Advised/instructed patient conservative measures for now may alternate between OTC Tylenol 1000 mg 1-2 times daily, as needed and/or OTC ibuprofen 600 to 800 mg 1-2 times daily, as needed for fever and myalgias.

## 2021-04-22 NOTE — ED Provider Notes (Signed)
[facility]    CSN: [id number] Arrival date & time: 04/22/21  1537      History   Chief Complaint Chief Complaint  Patient presents with   Covid Positive    HPI Melanie Serrano is a 30 y.o. female.   HPI 30 year old female presents with COVID-19 reports was positive exactly 7 days ago (04/15/21).  Patient reports fever, congestion, loss of taste and smell.  Patient is unvaccinated for COVID-19.  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 Authorizing Provider  methylPREDNISolone (MEDROL DOSEPAK) 4 MG TBPK tablet Take as directed. 04/22/21  Yes Trevor Iha, FNP  albuterol (VENTOLIN HFA) 108 (90 Base) MCG/ACT inhaler Inhale 1-2 puffs into the lungs every 6 (six) hours as needed for wheezing or shortness of breath. 07/19/20   Lurene Shadow, PA-C  escitalopram (LEXAPRO) 20 MG tablet Take by mouth. 03/30/19   [provider]  ibuprofen (ADVIL,MOTRIN) 600 MG tablet Take 1 tablet (600 mg total) by mouth every 6 (six) hours. 11/26/16   Roe Coombs, CNM    Family History Family History  Problem Relation Age of Onset   Cancer Father     Hypertension Father    Melanoma Father    COPD Mother    Hypertension Mother    Heart attack Maternal Grandfather    Cancer Paternal Grandmother    Diabetes Paternal Grandfather     Social History Social History   Tobacco Use   Smoking status: Every Day    Packs/day: 1.00    Types: Cigarettes   Smokeless tobacco: Current  Vaping Use   Vaping Use: Never used  Substance Use Topics   Alcohol use: Yes    Comment: rarely   Drug use: Yes    Frequency: 7.0 times per week    Types: Marijuana     Allergies   Shellfish allergy   Review of Systems Review of Systems  Constitutional:  Positive for fever.       Loss of taste and smell x 7 days  HENT:  Positive for congestion.   All other systems reviewed and are negative.   Physical Exam Triage Vital Signs ED Triage Vitals  Enc Vitals Group     BP 04/22/21 1608 112/76     Pulse Rate 04/22/21 1608 86     Resp 04/22/21 1608 18     Temp 04/22/21 1608 99.5 F (37.5  C)     Temp Source 04/22/21 1608 Oral     SpO2 04/22/21 1608 98 %     Weight 04/22/21 1609 157 lb (71.2 kg)     Height 04/22/21 1609 5\' 3"  (1.6 m)     Head Circumference --      Peak Flow --      Pain Score 04/22/21 1609 0     Pain Loc --      Pain Edu? --      Excl. in GC? --    No data found.  Updated Vital Signs BP 112/76 (BP Location: Left Arm)   Pulse 86   Temp 99.5 F (37.5 C) (Oral)   Resp 18   Ht 5\' 3"  (1.6 m)   Wt 157 lb (71.2 kg)   SpO2 98%   BMI 27.81 kg/m   Physical Exam Vitals and nursing note reviewed.  Constitutional:      General: She is not in acute distress.    Appearance: Normal appearance. She is normal weight. She is not ill-appearing.  HENT:     Head: Normocephalic and atraumatic.     Right Ear: Tympanic membrane, ear canal and external ear normal.     Left Ear: Tympanic membrane, ear canal and external ear normal.     Mouth/Throat:     Mouth: Mucous membranes are moist.     Pharynx: Oropharynx is clear.  Eyes:      Extraocular Movements: Extraocular movements intact.     Conjunctiva/sclera: Conjunctivae normal.     Pupils: Pupils are equal, round, and reactive to light.  Cardiovascular:     Rate and Rhythm: Normal rate and regular rhythm.     Pulses: Normal pulses.     Heart sounds: Normal heart sounds. No murmur heard. Pulmonary:     Effort: Pulmonary effort is normal.     Breath sounds: No stridor. Wheezing present. No rhonchi or rales.  Chest:     Chest wall: No tenderness.  Musculoskeletal:        General: Normal range of motion.     Cervical back: Normal range of motion and neck supple. No tenderness.  Lymphadenopathy:     Cervical: No cervical adenopathy.  Skin:    General: Skin is warm and dry.  Neurological:     General: No focal deficit present.     Mental Status: She is alert and oriented to person, place, and time. Mental status is at baseline.  Psychiatric:        Mood and Affect: Mood normal.        Behavior: Behavior normal.        Thought Content: Thought content normal.     UC Treatments / Results  Labs (all labs ordered are listed, but only abnormal results are displayed) Labs Reviewed - No data to display  EKG   Radiology No results found.  Procedures Procedures (including critical care time)  Medications Ordered in UC Medications - No data to display  Initial Impression / Assessment and Plan / UC Course  I have reviewed the triage vital signs and the nursing notes.  Pertinent labs & imaging results that were available during my care of the patient were reviewed by me and considered in my medical decision making (see chart for details).     MDM: 1. COVID-19-Advised/instructed patient conservative measures for now may alternate between OTC Tylenol 1000 mg 1-2 times daily, as needed and/or OTC Ibuprofen 600 to 800 mg 1-2 times daily, as  needed for fever and myalgias; 2.  Wheezing-Rx'd Medrol Dosepak. Advised/instructed patient to take medication as directed  with food to completion.  Encourage patient increase daily water intake while taking this medication.  Advised/instructed patient conservative measures for now may alternate between OTC Tylenol 1000 mg 1-2 times daily, as needed and/or OTC ibuprofen 600 to 800 mg 1-2 times daily, as needed for fever and myalgias.  Patient discharged home, hemodynamically stable. Final Clinical Impressions(s) / UC Diagnoses   Final diagnoses:  COVID-19  Wheezing     Discharge Instructions      Advised/instructed patient to take medication as directed with food to completion.  Encourage patient increase daily water intake while taking this medication.  Advised/instructed patient conservative measures for now may alternate between OTC Tylenol 1000 mg 1-2 times daily, as needed and/or OTC ibuprofen 600 to 800 mg 1-2 times daily, as needed for fever and myalgias.     ED Prescriptions     Medication Sig Dispense Auth. Provider   methylPREDNISolone (MEDROL DOSEPAK) 4 MG TBPK tablet Take as directed. 1 each Trevor Iha, FNP      PDMP not reviewed this encounter.   Trevor Iha, FNP 04/22/21 1644

## 2021-04-22 NOTE — ED Triage Notes (Signed)
Covid Positive x 1 week Fever, congestion, loss of taste and smell Unvaccinated

## 2023-10-09 ENCOUNTER — Encounter (HOSPITAL_COMMUNITY): Payer: Self-pay | Admitting: Obstetrics & Gynecology

## 2023-10-09 ENCOUNTER — Inpatient Hospital Stay (HOSPITAL_COMMUNITY)

## 2023-10-09 ENCOUNTER — Inpatient Hospital Stay (HOSPITAL_COMMUNITY)
Admission: AD | Admit: 2023-10-09 | Discharge: 2023-10-09 | Disposition: A | Discharge status: Home / Self Care | Attending: Obstetrics & Gynecology | Admitting: Obstetrics & Gynecology

## 2023-10-09 DIAGNOSIS — O039 Complete or unspecified spontaneous abortion without complication: Secondary | ICD-10-CM | POA: Diagnosis present

## 2023-10-09 DIAGNOSIS — Z3A01 Less than 8 weeks gestation of pregnancy: Secondary | ICD-10-CM | POA: Diagnosis not present

## 2023-10-09 HISTORY — DX: Urinary tract infection, site not specified: N39.0

## 2023-10-09 HISTORY — DX: Unspecified convulsions: R56.9

## 2023-10-09 HISTORY — DX: Polycystic ovarian syndrome: E28.2

## 2023-10-09 LAB — URINALYSIS, ROUTINE W REFLEX MICROSCOPIC

## 2023-10-09 LAB — CBC
HCT: 41.9 % (ref 36.0–46.0)
Hemoglobin: 15.1 g/dL — ABNORMAL HIGH (ref 12.0–15.0)
MCH: 31 pg (ref 26.0–34.0)
MCHC: 36 g/dL (ref 30.0–36.0)
MCV: 86 fL (ref 80.0–100.0)
Platelets: 272 10*3/uL (ref 150–400)
RBC: 4.87 MIL/uL (ref 3.87–5.11)
RDW: 12.5 % (ref 11.5–15.5)
WBC: 9.5 10*3/uL (ref 4.0–10.5)
nRBC: 0 % (ref 0.0–0.2)

## 2023-10-09 LAB — WET PREP, GENITAL
Clue Cells Wet Prep HPF POC: NONE SEEN
Sperm: NONE SEEN
Trich, Wet Prep: NONE SEEN
WBC, Wet Prep HPF POC: <10 (ref <10)
Yeast Wet Prep HPF POC: NONE SEEN

## 2023-10-09 LAB — URINALYSIS, MICROSCOPIC (REFLEX)
RBC / HPF: >50 RBC/hpf (ref 0–5)
WBC, UA: >50 WBC/hpf (ref 0–5)

## 2023-10-09 LAB — HCG, QUANTITATIVE, PREGNANCY: hCG, Beta Chain, Quant, S: 4 m[IU]/mL (ref <5)

## 2023-10-09 LAB — POCT PREGNANCY, URINE: Preg Test, Ur: POSITIVE — AB

## 2023-10-09 NOTE — MAU Note (Signed)
 Dwight Burdo is a 33 y.o. at [redacted]w[redacted]d, here in MAU reporting: Monday went to the dr, was injected with an antibiotic(Rocephin) and a steroid for a sinus infection. Had a light + urine preg, did blood work - hcg was a 6. So they took it as not pregnant and went ahead with injections. Took another test today and it was  very positive, but now she is "pouring out blood'.  Started bleeding this morning, Light now.  (Urine spec was dark red ).  Started having cramping in lower abd and back after the onset of bleeding.  Feels very hot, like she is going to burst into flames.  (Cool, damp cloth to back of neck.)  pt tearful.  LMP: 2/3 Onset of complaint: bleeding Pain score: 5 Vitals:   10/09/23 1200  BP: 135/88  Pulse: 91  Resp: 18  Temp: 99.2 F (37.3 C)  SpO2: 100%      Lab orders placed from triage:  UPT is + here. Blood work drawn while pt in triage.  Explained what we are drawing.   [facility] in [place].

## 2023-10-09 NOTE — MAU Provider Note (Signed)
 History     CSN: [id number]  Arrival date and time: 10/09/23 1132   Event Date/Time   First Provider Initiated Contact with Patient 10/09/23 1255      Chief Complaint  Patient presents with   Vaginal Bleeding   Abdominal Pain   Back Pain   Possible Pregnancy   HPI  Melanie Serrano is a 33 y.o. [id number] at [redacted]w[redacted]d who presents for evaluation of possible miscarriage. Patient reports she had an HCG of 6 in the office on Monday and multiple positive pregnancy tests at home. Today she started bleeding like a period. She denies any pain. Denies any constipation, diarrhea or any urinary complaints. OB History     Gravida  4   Para  2   Term  2   Preterm      AB  1   Living  2      SAB  1   IAB      Ectopic      Multiple  0   Live Births  2           Past Medical History:  Diagnosis Date   Anxiety    Depression    PCOS (polycystic ovarian syndrome)    Seizures (HCC)    seizure like activity, saw neuro "non-epileptic", got on b-6, magnesium , etc- seems to be under control now   UTI (urinary tract infection)     Past Surgical History:  Procedure Laterality Date   WISDOM TOOTH EXTRACTION      Family History  Problem Relation Age of Onset   COPD Mother    Hypertension Mother    Diabetes Mother    Heart defect Mother        block aorta   Cancer Father        prostate   Hypertension Father    Melanoma Father    Sarcoidosis Father    Valvular heart disease Father        aortic valve replaced   Heart block Maternal Grandmother        blocked aorta, died 62   Heart attack Maternal Grandmother        first age 91   Heart attack Maternal Grandfather    Cancer Paternal Grandmother    Diabetes Paternal Grandfather     Social History   Tobacco Use   Smoking status: Former    Current packs/day: 1.00    Types: Cigarettes   Smokeless tobacco: Never   Tobacco comments:    Quit cigarettes 2023  Vaping Use   Vaping status: Every Day  Substance Use  Topics   Alcohol use: Not Currently    Comment: rarely   Drug use: Yes    Frequency: 7.0 times per week    Types: Marijuana    Allergies:  Allergies  Allergen Reactions   Shellfish Allergy Itching and Nausea And Vomiting    No medications prior to admission.    Review of Systems  Constitutional: Negative.  Negative for fatigue and fever.  HENT: Negative.    Respiratory: Negative.  Negative for shortness of breath.   Cardiovascular: Negative.  Negative for chest pain.  Gastrointestinal: Negative.  Negative for abdominal pain, constipation, diarrhea, nausea and vomiting.  Genitourinary: Negative.  Negative for dysuria.  Neurological: Negative.  Negative for dizziness and headaches.   Physical Exam   Blood pressure 123/81, pulse 81, temperature 99.2 F (37.3 C), temperature source Oral, resp. rate 18, height 5\' 3"  (1.6 m), weight 83.6  kg, last menstrual period 09/06/2023, SpO2 100%, unknown if currently breastfeeding.  Patient Vitals for the past 24 hrs:  BP Temp Temp src Pulse Resp SpO2 Height Weight  10/09/23 1332 123/81 -- -- 81 18 100 % -- --  10/09/23 1200 135/88 99.2 F (37.3 C) Oral 91 18 100 % 5\' 3"  (1.6 m) 83.6 kg    Physical Exam Vitals and nursing note reviewed.  Constitutional:      General: She is not in acute distress.    Appearance: She is well-developed.  HENT:     Head: Normocephalic.  Eyes:     Pupils: Pupils are equal, round, and reactive to light.  Cardiovascular:     Rate and Rhythm: Normal rate and regular rhythm.     Heart sounds: Normal heart sounds.  Pulmonary:     Effort: Pulmonary effort is normal. No respiratory distress.     Breath sounds: Normal breath sounds.  Abdominal:     General: Bowel sounds are normal. There is no distension.     Palpations: Abdomen is soft.     Tenderness: There is no abdominal tenderness.  Skin:    General: Skin is warm and dry.  Neurological:     Mental Status: She is alert and oriented to person, place,  and time.  Psychiatric:        Mood and Affect: Mood normal.        Behavior: Behavior normal.        Thought Content: Thought content normal.        Judgment: Judgment normal.     MAU Course  Procedures  Results for orders placed or performed during the hospital encounter of 10/09/23 (from the past 24 hours)  Urinalysis, Routine w reflex microscopic -Urine, Clean Catch     Status: Abnormal   Collection Time: 10/09/23 11:56 AM  Result Value Ref Range   Color, Urine RED (A) YELLOW   APPearance CLOUDY (A) CLEAR   Specific Gravity, Urine  1.005 - 1.030    TEST NOT REPORTED DUE TO COLOR INTERFERENCE OF URINE PIGMENT   pH  5.0 - 8.0    TEST NOT REPORTED DUE TO COLOR INTERFERENCE OF URINE PIGMENT   Glucose, UA (A) NEGATIVE mg/dL    TEST NOT REPORTED DUE TO COLOR INTERFERENCE OF URINE PIGMENT   Hgb urine dipstick (A) NEGATIVE    TEST NOT REPORTED DUE TO COLOR INTERFERENCE OF URINE PIGMENT   Bilirubin Urine (A) NEGATIVE    TEST NOT REPORTED DUE TO COLOR INTERFERENCE OF URINE PIGMENT   Ketones, ur (A) NEGATIVE mg/dL    TEST NOT REPORTED DUE TO COLOR INTERFERENCE OF URINE PIGMENT   Protein, ur (A) NEGATIVE mg/dL    TEST NOT REPORTED DUE TO COLOR INTERFERENCE OF URINE PIGMENT   Nitrite (A) NEGATIVE    TEST NOT REPORTED DUE TO COLOR INTERFERENCE OF URINE PIGMENT   Leukocytes,Ua (A) NEGATIVE    TEST NOT REPORTED DUE TO COLOR INTERFERENCE OF URINE PIGMENT  Pregnancy, urine POC     Status: Abnormal   Collection Time: 10/09/23 11:56 AM  Result Value Ref Range   Preg Test, Ur POSITIVE (A) NEGATIVE  Urinalysis, Microscopic (reflex)     Status: Abnormal   Collection Time: 10/09/23 11:56 AM  Result Value Ref Range   RBC / HPF >50 0 - 5 RBC/hpf   WBC, UA >50 0 - 5 WBC/hpf   Bacteria, UA MANY (A) NONE SEEN   Squamous Epithelial / HPF 6-10 0 -  5 /HPF  CBC     Status: Abnormal   Collection Time: 10/09/23 12:00 PM  Result Value Ref Range   WBC 9.5 4.0 - 10.5 K/uL   RBC 4.87 3.87 - 5.11  MIL/uL   Hemoglobin 15.1 (H) 12.0 - 15.0 g/dL   HCT 78.2 95.6 - 21.3 %   MCV 86.0 80.0 - 100.0 fL   MCH 31.0 26.0 - 34.0 pg   MCHC 36.0 30.0 - 36.0 g/dL   RDW 08.6 57.8 - 46.9 %   Platelets 272 150 - 400 K/uL   nRBC 0.0 0.0 - 0.2 %  hCG, quantitative, pregnancy     Status: None   Collection Time: 10/09/23 12:00 PM  Result Value Ref Range   hCG, Beta Chain, Quant, S 4 <5 mIU/mL  Wet prep, genital     Status: None   Collection Time: 10/09/23 12:24 PM  Result Value Ref Range   Yeast Wet Prep HPF POC NONE SEEN NONE SEEN   Trich, Wet Prep NONE SEEN NONE SEEN   Clue Cells Wet Prep HPF POC NONE SEEN NONE SEEN   WBC, Wet Prep HPF POC <10 <10   Sperm NONE SEEN      US OB LESS THAN 14 WEEKS WITH OB TRANSVAGINAL Result Date: 10/09/2023 CLINICAL DATA:  Vaginal bleeding in 1st trimester pregnancy. EXAM: OBSTETRIC <14 WK [place] AND TRANSVAGINAL OB US TECHNIQUE: Both transabdominal and transvaginal ultrasound examinations were performed for complete evaluation of the gestation as well as the maternal uterus, adnexal regions, and pelvic cul-de-sac. Transvaginal technique was performed to assess early pregnancy. COMPARISON:  None Available. FINDINGS: Intrauterine gestational sac: None Maternal uterus/adnexae: Endometrial thickness measures 11 mm. No fibroids identified. Both ovaries are normal in appearance. No mass or abnormal free fluid identified. IMPRESSION: Pregnancy of unknown anatomic location (no intrauterine gestational sac or adnexal mass identified). Differential diagnosis includes recent spontaneous abortion, IUP too early to visualize, and non-visualized ectopic pregnancy. Recommend followup of beta-hCG levels, and follow up US as warranted clinically. Electronically Signed   By: Danae Orleans M.D.   On: 10/09/2023 13:11     MDM Labs ordered and reviewed.   UA, UPT CBC, HCG ABO/Rh- O Pos Wet prep and gc/chlamydia US OB Comp Less 14 weeks with Transvaginal  Reviewed negative HCG today and  condolences provided.   Assessment and Plan   1. Miscarriage   2. [redacted] weeks gestation of pregnancy     -Discharge home in stable condition -Vaginal bleeding precautions discussed -Patient advised to follow-up with OB as scheduled in April for follow up -Patient may return to MAU as needed or if her condition were to change or worsen  Rolm Bookbinder, CNM 10/09/2023, 2:17 PM

## 2023-10-11 LAB — GC/CHLAMYDIA PROBE AMP (~~LOC~~) NOT AT ARMC
Chlamydia: NEGATIVE
Comment: NEGATIVE
Comment: NORMAL
Neisseria Gonorrhea: NEGATIVE

## 2023-10-26 ENCOUNTER — Ambulatory Visit (INDEPENDENT_AMBULATORY_CARE_PROVIDER_SITE_OTHER): Admitting: Obstetrics and Gynecology

## 2023-10-26 ENCOUNTER — Encounter: Payer: Self-pay | Admitting: Obstetrics and Gynecology

## 2023-10-26 VITALS — BP 121/78 | HR 74 | Ht 63.0 in | Wt 189.0 lb

## 2023-10-26 DIAGNOSIS — N96 Recurrent pregnancy loss: Secondary | ICD-10-CM

## 2023-10-26 MED ORDER — CLINDAMYCIN PHOSPHATE 1 % EX GEL: Freq: Two times a day (BID) | CUTANEOUS | 11 refills | Status: AC

## 2023-10-26 NOTE — Progress Notes (Signed)
   Follow up visit.   Subjective:  Melanie Serrano is a 33 y.o. [id number] being seen today for follow up SAB. This is her 2nd, first trimester SAB. She was seen in MAU on 3/8 and diagnosed with a complete SAB. She reports several positive pregnancy tests at home and an HCG of 6 then 4 in the office.   She  has MDD (major depressive disorder); MDD (major depressive disorder), recurrent severe, without psychosis (HCC); Supervision of other normal pregnancy, antepartum; GERD (gastroesophageal reflux disease); Tetrahydrocannabinol (THC) use disorder, mild, abuse; Positive GBS test; and Post-dates pregnancy on their problem list.   Reviewed previous pregnancies today  G1- Full term, no intrapartum or PP complications.  G2- SAB @ early in the first trimester. No surgery no medication warranted.  G3- Full term, no antepartum or PP complications.  G4- Early first trimester SAB.  Reports Regular periods every 30-35 days Does report hirtisim but has never been diagnosed with PCOS. Mom is 65 years and has had a stroke. Mom has memory deficit from stroke. No other clotting disorders in the family. She denies a personal history blood clotting disorder.    Patient reports no complaints.  The following portions of the patient's history were reviewed and updated as appropriate: allergies, current medications, past family history, past medical history, past social history, past surgical history and problem list.   Objective:   Vitals:   10/26/23 1356  BP: 121/78  Pulse: 74  Weight: 189 lb (85.7 kg)  Height: 5\' 3"  (1.6 m)   General:  Alert, oriented and cooperative. Patient is in no acute distress.  Skin: Skin is warm and dry. No rash noted.   Cardiovascular: Normal heart rate noted  Respiratory: Normal respiratory effort, no problems with respiration noted  Abdomen: Soft, gravid, appropriate for gestational age.        Pelvic: Cervical exam deferred        Extremities: Normal range of motion.      Mental Status: Normal mood and affect. Normal behavior. Normal judgment and thought content.   Assessment and Plan:   1. History of recurrent miscarriages (Primary)  - TSH - Vitamin D (25 hydroxy) - HgB A1c - Antiphospholipid Syndrome Comp- will repeat in 12 weeks if any of these are positive.  - &lasminogen Activator Inhibitor1 4G/5G Polymorphism  -Will get HSG or SIS - referral message sent.  - Continue prenatal vitamins and add 1 mg of folic acid  Would consider karyotype if the above are all negative.   Please refer to After Visit Summary for other counseling recommendations.   No follow-ups on file.  No future appointments.  Venia Carbon, NP

## 2023-10-27 LAB — HEMOGLOBIN A1C
Est. average glucose Bld gHb Est-mCnc: 100 mg/dL
Hgb A1c MFr Bld: 5.1 % (ref 4.8–5.6)

## 2023-10-27 LAB — VITAMIN D 25 HYDROXY (VIT D DEFICIENCY, FRACTURES): Vit D, 25-Hydroxy: 41.6 ng/mL (ref 30.0–100.0)

## 2023-10-27 LAB — TSH: TSH: 2.34 u[IU]/mL (ref 0.450–4.500)

## 2023-11-03 LAB — PLASMINOGEN ACTIVATOR INHIBITOR 1(PAI-1) 4G/5G POLYMORPHISM

## 2023-11-13 LAB — ANTIPHOSPHOLIPID SYNDROME COMP
APTT: 27 s
Anticardiolipin Ab, IgA: <10 [APL'U]
Anticardiolipin Ab, IgG: <10 [GPL'U]
Anticardiolipin Ab, IgM: <10 [MPL'U]
Antiphosphatidylserine IgG: 1 {GPS'U}
Antiphosphatidylserine IgM: 0 {MPS'U}
Antiprothrombin Antibody, IgG: 4 G units
Beta-2 Glycoprotein I, IgA: <10 SAU
Beta-2 Glycoprotein I, IgG: <10 SGU
Beta-2 Glycoprotein I, IgM: <10 SMU
DRVVT Screen Seconds: 30.4 s
Hexagonal Phospholipid Neutral: 5 s
Platelet Neutralization: 1.1 s

## 2023-11-15 ENCOUNTER — Encounter: Admitting: Obstetrics and Gynecology

## 2023-12-17 ENCOUNTER — Encounter: Payer: Self-pay | Admitting: Obstetrics and Gynecology

## 2023-12-17 DIAGNOSIS — Z1589 Genetic susceptibility to other disease: Secondary | ICD-10-CM | POA: Insufficient documentation

## 2024-01-05 ENCOUNTER — Telehealth: Admitting: Physician Assistant

## 2024-01-05 DIAGNOSIS — L304 Erythema intertrigo: Secondary | ICD-10-CM | POA: Diagnosis not present

## 2024-01-05 MED ORDER — NYSTATIN 100000 UNIT/GM EX CREA: 1.0000 | TOPICAL_CREAM | Freq: Two times a day (BID) | CUTANEOUS | 0 refills | Status: AC

## 2024-01-05 NOTE — Progress Notes (Signed)
E Visit for Rash  We are sorry that you are not feeling well. Here is how we plan to help!  Based upon your presentation it appears you have a fungal infection.  I have prescribed: and Nystatin cream apply to the affected area twice daily   HOME CARE:  Take cool showers and avoid direct sunlight. Apply cool compress or wet dressings. Take a bath in an oatmeal bath.  Sprinkle content of one Aveeno packet under running faucet with comfortably warm water.  Bathe for 15-20 minutes, 1-2 times daily.  Pat dry with a towel. Do not rub the rash. Use hydrocortisone cream. Take an antihistamine like Benadryl for widespread rashes that itch.  The adult dose of Benadryl is 25-50 mg by mouth 4 times daily. Caution:  This type of medication may cause sleepiness.  Do not drink alcohol, drive, or operate dangerous machinery while taking antihistamines.  Do not take these medications if you have prostate enlargement.  Read package instructions thoroughly on all medications that you take.  GET HELP RIGHT AWAY IF:  Symptoms don't go away after treatment. Severe itching that persists. If you rash spreads or swells. If you rash begins to smell. If it blisters and opens or develops a yellow-brown crust. You develop a fever. You have a sore throat. You become short of breath.  MAKE SURE YOU:  Understand these instructions. Will watch your condition. Will get help right away if you are not doing well or get worse.  Thank you for choosing an e-visit.  Your e-visit answers were reviewed by a board certified advanced clinical practitioner to complete your personal care plan. Depending upon the condition, your plan could have included both over the counter or prescription medications.  Please review your pharmacy choice. Make sure the pharmacy is open so you can pick up prescription now. If there is a problem, you may contact your provider through MyChart messaging and have the prescription routed to  another pharmacy.  Your safety is important to us. If you have drug allergies check your prescription carefully.   For the next 24 hours you can use MyChart to ask questions about today's visit, request a non-urgent call back, or ask for a work or school excuse. You will get an email in the next two days asking about your experience. I hope that your e-visit has been valuable and will speed your recovery.  I have spent 5 minutes in review of e-visit questionnaire, review and updating patient chart, medical decision making and response to patient.   Sherrol Vicars M Tamarick Kovalcik, PA-C
